# Patient Record
Sex: Female | Born: 1980 | Race: Black or African American | Hispanic: No | Marital: Single | Smoking: Former smoker
Health system: Southern US, Community
[De-identification: ages and names within clinical notes are randomized; demographics above are authoritative.]

## PROBLEM LIST (undated history)

## (undated) ENCOUNTER — Inpatient Hospital Stay (HOSPITAL_COMMUNITY): Payer: Self-pay

## (undated) DIAGNOSIS — R55 Syncope and collapse: Secondary | ICD-10-CM

## (undated) DIAGNOSIS — R87629 Unspecified abnormal cytological findings in specimens from vagina: Secondary | ICD-10-CM

## (undated) DIAGNOSIS — R87619 Unspecified abnormal cytological findings in specimens from cervix uteri: Secondary | ICD-10-CM

## (undated) DIAGNOSIS — R51 Headache: Secondary | ICD-10-CM

## (undated) DIAGNOSIS — K219 Gastro-esophageal reflux disease without esophagitis: Secondary | ICD-10-CM

## (undated) DIAGNOSIS — IMO0002 Reserved for concepts with insufficient information to code with codable children: Secondary | ICD-10-CM

## (undated) DIAGNOSIS — J40 Bronchitis, not specified as acute or chronic: Secondary | ICD-10-CM

## (undated) DIAGNOSIS — E876 Hypokalemia: Secondary | ICD-10-CM

## (undated) DIAGNOSIS — R519 Headache, unspecified: Secondary | ICD-10-CM

## (undated) HISTORY — DX: Reserved for concepts with insufficient information to code with codable children: IMO0002

## (undated) HISTORY — PX: WISDOM TOOTH EXTRACTION: SHX21

## (undated) HISTORY — DX: Unspecified abnormal cytological findings in specimens from cervix uteri: R87.619

---

## 2006-04-16 ENCOUNTER — Inpatient Hospital Stay (HOSPITAL_COMMUNITY): Admission: AD | Admit: 2006-04-16 | Discharge: 2006-04-16 | Payer: Self-pay | Admitting: Obstetrics & Gynecology

## 2006-04-18 ENCOUNTER — Ambulatory Visit: Payer: Self-pay | Admitting: Obstetrics and Gynecology

## 2006-04-18 ENCOUNTER — Encounter (INDEPENDENT_AMBULATORY_CARE_PROVIDER_SITE_OTHER): Payer: Self-pay | Admitting: Specialist

## 2006-04-18 ENCOUNTER — Inpatient Hospital Stay (HOSPITAL_COMMUNITY): Admission: AD | Admit: 2006-04-18 | Discharge: 2006-04-20 | Payer: Self-pay | Admitting: Obstetrics and Gynecology

## 2008-12-05 ENCOUNTER — Emergency Department (HOSPITAL_COMMUNITY): Admission: EM | Admit: 2008-12-05 | Discharge: 2008-12-06 | Payer: Self-pay | Admitting: Emergency Medicine

## 2009-08-29 ENCOUNTER — Emergency Department (HOSPITAL_COMMUNITY): Admission: EM | Admit: 2009-08-29 | Discharge: 2009-08-29 | Payer: Self-pay | Admitting: Emergency Medicine

## 2009-11-18 ENCOUNTER — Inpatient Hospital Stay (HOSPITAL_COMMUNITY): Admission: AD | Admit: 2009-11-18 | Discharge: 2009-11-18 | Payer: Self-pay | Admitting: Obstetrics and Gynecology

## 2010-01-01 ENCOUNTER — Emergency Department (HOSPITAL_COMMUNITY): Admission: EM | Admit: 2010-01-01 | Discharge: 2010-01-01 | Payer: Self-pay | Admitting: Emergency Medicine

## 2010-02-28 ENCOUNTER — Emergency Department (HOSPITAL_COMMUNITY): Admission: EM | Admit: 2010-02-28 | Discharge: 2010-02-28 | Payer: Self-pay | Admitting: Emergency Medicine

## 2010-08-02 ENCOUNTER — Inpatient Hospital Stay (HOSPITAL_COMMUNITY)
Admission: AD | Admit: 2010-08-02 | Discharge: 2010-08-02 | Payer: Self-pay | Source: Home / Self Care | Attending: Obstetrics and Gynecology | Admitting: Obstetrics and Gynecology

## 2010-08-07 NOTE — L&D Delivery Note (Signed)
Delivery Note At 11:41 PM a viable female was delivered via Vaginal, Spontaneous Delivery (Presentation: Left Occiput Anterior) in caul.  APGAR: 8, 9; weight . 7-1  Placenta status: Intact, Spontaneous.  Cord: 3 vessels with the following complications: None.   Anesthesia: None  Episiotomy: None Lacerations: None Suture Repair: NA Est. Blood Loss (mL): 400 ml  Mom to postpartum.  Baby to nursery-stable. Placenta to pathology for Oligo.  Lorita Forinash 03/31/2011, 12:06 AM

## 2010-08-11 ENCOUNTER — Inpatient Hospital Stay (HOSPITAL_COMMUNITY)
Admission: AD | Admit: 2010-08-11 | Discharge: 2010-08-11 | Payer: Self-pay | Source: Home / Self Care | Attending: Obstetrics & Gynecology | Admitting: Obstetrics & Gynecology

## 2010-08-11 LAB — URINALYSIS, ROUTINE W REFLEX MICROSCOPIC
Bilirubin Urine: NEGATIVE
Ketones, ur: NEGATIVE mg/dL
Nitrite: NEGATIVE
Protein, ur: NEGATIVE mg/dL
Specific Gravity, Urine: 1.025 (ref 1.005–1.030)
Urine Glucose, Fasting: NEGATIVE mg/dL
Urobilinogen, UA: 0.2 mg/dL (ref 0.0–1.0)
pH: 6 (ref 5.0–8.0)

## 2010-08-11 LAB — URINE MICROSCOPIC-ADD ON

## 2010-08-24 ENCOUNTER — Inpatient Hospital Stay (HOSPITAL_COMMUNITY)
Admission: AD | Admit: 2010-08-24 | Discharge: 2010-08-24 | Payer: Self-pay | Source: Home / Self Care | Attending: Obstetrics and Gynecology | Admitting: Obstetrics and Gynecology

## 2010-08-29 LAB — URINE MICROSCOPIC-ADD ON

## 2010-08-29 LAB — URINE CULTURE
Colony Count: NO GROWTH
Culture  Setup Time: 201201181628
Culture: NO GROWTH

## 2010-08-29 LAB — URINALYSIS, ROUTINE W REFLEX MICROSCOPIC
Ketones, ur: 15 mg/dL — AB
Nitrite: NEGATIVE
Protein, ur: 30 mg/dL — AB
Specific Gravity, Urine: >1.03 — ABNORMAL HIGH (ref 1.005–1.030)
Urine Glucose, Fasting: NEGATIVE mg/dL
Urobilinogen, UA: 0.2 mg/dL (ref 0.0–1.0)
pH: 6 (ref 5.0–8.0)

## 2010-09-03 ENCOUNTER — Emergency Department (HOSPITAL_COMMUNITY)
Admission: EM | Admit: 2010-09-03 | Discharge: 2010-09-03 | Payer: Self-pay | Source: Home / Self Care | Admitting: Emergency Medicine

## 2010-09-03 LAB — POCT I-STAT, CHEM 8
BUN: 8 mg/dL (ref 6–23)
Calcium, Ion: 1.16 mmol/L (ref 1.12–1.32)
Chloride: 102 mEq/L (ref 96–112)
Creatinine, Ser: 1 mg/dL (ref 0.4–1.2)
Glucose, Bld: 104 mg/dL — ABNORMAL HIGH (ref 70–99)
HCT: 37 % (ref 36.0–46.0)
Hemoglobin: 12.6 g/dL (ref 12.0–15.0)
Potassium: 3.8 mEq/L (ref 3.5–5.1)
Sodium: 136 mEq/L (ref 135–145)
TCO2: 23 mmol/L (ref 0–100)

## 2010-09-03 LAB — URINALYSIS, ROUTINE W REFLEX MICROSCOPIC
Ketones, ur: 15 mg/dL — AB
Nitrite: NEGATIVE
Protein, ur: 30 mg/dL — AB
Specific Gravity, Urine: 1.037 — ABNORMAL HIGH (ref 1.005–1.030)
Urine Glucose, Fasting: NEGATIVE mg/dL
Urobilinogen, UA: 1 mg/dL (ref 0.0–1.0)
pH: 6 (ref 5.0–8.0)

## 2010-09-03 LAB — URINE MICROSCOPIC-ADD ON

## 2010-09-03 LAB — HCG, QUANTITATIVE, PREGNANCY: hCG, Beta Chain, Quant, S: 172979 m[IU]/mL — ABNORMAL HIGH (ref <5)

## 2010-09-04 LAB — URINE CULTURE: Culture  Setup Time: 201201282155

## 2010-10-12 ENCOUNTER — Inpatient Hospital Stay (HOSPITAL_COMMUNITY)
Admission: AD | Admit: 2010-10-12 | Discharge: 2010-10-12 | Disposition: A | Discharge status: Home / Self Care | Payer: Medicaid Other | Source: Ambulatory Visit | Attending: Obstetrics & Gynecology | Admitting: Obstetrics & Gynecology

## 2010-10-12 DIAGNOSIS — O210 Mild hyperemesis gravidarum: Secondary | ICD-10-CM | POA: Insufficient documentation

## 2010-10-12 LAB — URINALYSIS, ROUTINE W REFLEX MICROSCOPIC
Glucose, UA: 100 mg/dL — AB
Ketones, ur: >80 mg/dL — AB
Nitrite: POSITIVE — AB
Protein, ur: 30 mg/dL — AB
Specific Gravity, Urine: 1.02 (ref 1.005–1.030)
Urobilinogen, UA: >8 mg/dL — ABNORMAL HIGH (ref 0.0–1.0)
pH: 8 (ref 5.0–8.0)

## 2010-10-12 LAB — URINE MICROSCOPIC-ADD ON

## 2010-10-14 LAB — URINE CULTURE
Colony Count: 10000
Culture  Setup Time: 201203080445

## 2010-10-17 LAB — URINE CULTURE
Colony Count: NO GROWTH
Culture  Setup Time: 201112280115
Culture: NO GROWTH

## 2010-10-17 LAB — URINALYSIS, ROUTINE W REFLEX MICROSCOPIC
Bilirubin Urine: NEGATIVE
Glucose, UA: NEGATIVE mg/dL
Ketones, ur: NEGATIVE mg/dL
Nitrite: NEGATIVE
Protein, ur: NEGATIVE mg/dL
Specific Gravity, Urine: 1.025 (ref 1.005–1.030)
Urobilinogen, UA: 0.2 mg/dL (ref 0.0–1.0)
pH: 7 (ref 5.0–8.0)

## 2010-10-17 LAB — URINE MICROSCOPIC-ADD ON

## 2010-10-17 LAB — POCT PREGNANCY, URINE: Preg Test, Ur: POSITIVE

## 2010-10-17 LAB — WET PREP, GENITAL
Clue Cells Wet Prep HPF POC: NONE SEEN
Yeast Wet Prep HPF POC: NONE SEEN

## 2010-10-18 ENCOUNTER — Observation Stay (HOSPITAL_COMMUNITY)
Admission: AD | Admit: 2010-10-18 | Discharge: 2010-10-19 | Disposition: A | Discharge status: Home / Self Care | Payer: Medicaid Other | Source: Ambulatory Visit | Attending: Obstetrics & Gynecology | Admitting: Obstetrics & Gynecology

## 2010-10-18 DIAGNOSIS — O210 Mild hyperemesis gravidarum: Secondary | ICD-10-CM | POA: Insufficient documentation

## 2010-10-18 DIAGNOSIS — O99019 Anemia complicating pregnancy, unspecified trimester: Secondary | ICD-10-CM

## 2010-10-18 DIAGNOSIS — O2390 Unspecified genitourinary tract infection in pregnancy, unspecified trimester: Principal | ICD-10-CM | POA: Insufficient documentation

## 2010-10-18 DIAGNOSIS — N12 Tubulo-interstitial nephritis, not specified as acute or chronic: Secondary | ICD-10-CM | POA: Insufficient documentation

## 2010-10-18 DIAGNOSIS — A5901 Trichomonal vulvovaginitis: Secondary | ICD-10-CM | POA: Insufficient documentation

## 2010-10-18 DIAGNOSIS — O98819 Other maternal infectious and parasitic diseases complicating pregnancy, unspecified trimester: Secondary | ICD-10-CM | POA: Insufficient documentation

## 2010-10-18 DIAGNOSIS — D649 Anemia, unspecified: Secondary | ICD-10-CM

## 2010-10-18 DIAGNOSIS — E876 Hypokalemia: Secondary | ICD-10-CM | POA: Insufficient documentation

## 2010-10-18 LAB — COMPREHENSIVE METABOLIC PANEL
ALT: 12 U/L (ref 0–35)
AST: 19 U/L (ref 0–37)
Alkaline Phosphatase: 42 U/L (ref 39–117)
Calcium: 8.7 mg/dL (ref 8.4–10.5)
GFR calc Af Amer: >60 mL/min (ref >60)
Potassium: 3 mEq/L — ABNORMAL LOW (ref 3.5–5.1)
Sodium: 136 mEq/L (ref 135–145)
Total Protein: 5.8 g/dL — ABNORMAL LOW (ref 6.0–8.3)

## 2010-10-18 LAB — DIFFERENTIAL
Basophils Absolute: 0 10*3/uL (ref 0.0–0.1)
Basophils Relative: 0 % (ref 0–1)
Eosinophils Relative: 0 % (ref 0–5)
Monocytes Absolute: 1.1 10*3/uL — ABNORMAL HIGH (ref 0.1–1.0)
Neutro Abs: 6.7 10*3/uL (ref 1.7–7.7)

## 2010-10-18 LAB — CBC
Hemoglobin: 9.8 g/dL — ABNORMAL LOW (ref 12.0–15.0)
MCHC: 33.9 g/dL (ref 30.0–36.0)
RDW: 15.5 % (ref 11.5–15.5)
WBC: 9.8 10*3/uL (ref 4.0–10.5)

## 2010-10-18 LAB — URINALYSIS, ROUTINE W REFLEX MICROSCOPIC
Glucose, UA: NEGATIVE mg/dL
Ketones, ur: >80 mg/dL — AB
Protein, ur: 100 mg/dL — AB
pH: 6 (ref 5.0–8.0)

## 2010-10-18 LAB — URINE MICROSCOPIC-ADD ON

## 2010-10-19 ENCOUNTER — Inpatient Hospital Stay (HOSPITAL_COMMUNITY): Payer: Medicaid Other

## 2010-10-19 LAB — WET PREP, GENITAL

## 2010-10-19 LAB — BASIC METABOLIC PANEL
Chloride: 107 mEq/L (ref 96–112)
GFR calc Af Amer: >60 mL/min (ref >60)
Potassium: 3.1 mEq/L — ABNORMAL LOW (ref 3.5–5.1)
Sodium: 132 mEq/L — ABNORMAL LOW (ref 135–145)

## 2010-10-19 LAB — GC/CHLAMYDIA PROBE AMP, GENITAL
Chlamydia, DNA Probe: NEGATIVE
GC Probe Amp, Genital: NEGATIVE

## 2010-10-20 LAB — URINE CULTURE: Culture  Setup Time: 201203140122

## 2010-10-24 LAB — URINALYSIS, ROUTINE W REFLEX MICROSCOPIC
Ketones, ur: NEGATIVE mg/dL
Nitrite: NEGATIVE
Protein, ur: NEGATIVE mg/dL
Urobilinogen, UA: 0.2 mg/dL (ref 0.0–1.0)

## 2010-10-24 LAB — CBC
HCT: 36.2 % (ref 36.0–46.0)
Hemoglobin: 12.2 g/dL (ref 12.0–15.0)
MCHC: 33.7 g/dL (ref 30.0–36.0)
RBC: 4.36 MIL/uL (ref 3.87–5.11)

## 2010-10-24 LAB — DIFFERENTIAL
Basophils Relative: 1 % (ref 0–1)
Eosinophils Absolute: 0.2 10*3/uL (ref 0.0–0.7)
Lymphs Abs: 2.7 10*3/uL (ref 0.7–4.0)
Neutrophils Relative %: 50 % (ref 43–77)

## 2010-10-24 LAB — COMPREHENSIVE METABOLIC PANEL
ALT: 9 U/L (ref 0–35)
Alkaline Phosphatase: 37 U/L — ABNORMAL LOW (ref 39–117)
CO2: 28 mEq/L (ref 19–32)
Calcium: 9.2 mg/dL (ref 8.4–10.5)
GFR calc non Af Amer: >60 mL/min (ref >60)
Glucose, Bld: 94 mg/dL (ref 70–99)
Sodium: 140 mEq/L (ref 135–145)

## 2010-10-24 LAB — LIPASE, BLOOD: Lipase: 20 U/L (ref 11–59)

## 2010-10-26 LAB — CBC
MCV: 83.4 fL (ref 78.0–100.0)
Platelets: 210 10*3/uL (ref 150–400)
RDW: 14.4 % (ref 11.5–15.5)
WBC: 6.8 10*3/uL (ref 4.0–10.5)

## 2010-10-26 LAB — URINALYSIS, ROUTINE W REFLEX MICROSCOPIC
Bilirubin Urine: NEGATIVE
Specific Gravity, Urine: 1.02 (ref 1.005–1.030)
pH: 7 (ref 5.0–8.0)

## 2010-10-26 LAB — URINE CULTURE

## 2010-10-26 LAB — WET PREP, GENITAL
Clue Cells Wet Prep HPF POC: NONE SEEN
Trich, Wet Prep: NONE SEEN

## 2010-10-26 LAB — URINE MICROSCOPIC-ADD ON

## 2010-10-26 LAB — HCG, SERUM, QUALITATIVE: Preg, Serum: NEGATIVE

## 2010-11-15 LAB — URINALYSIS, ROUTINE W REFLEX MICROSCOPIC
Bilirubin Urine: NEGATIVE
Ketones, ur: NEGATIVE mg/dL
Nitrite: POSITIVE — AB
pH: 6 (ref 5.0–8.0)

## 2010-11-15 LAB — URINE MICROSCOPIC-ADD ON

## 2010-11-15 LAB — POCT I-STAT, CHEM 8
BUN: 10 mg/dL (ref 6–23)
Calcium, Ion: 1.03 mmol/L — ABNORMAL LOW (ref 1.12–1.32)
Chloride: 106 mEq/L (ref 96–112)
Glucose, Bld: 101 mg/dL — ABNORMAL HIGH (ref 70–99)
TCO2: 22 mmol/L (ref 0–100)

## 2010-11-15 LAB — PREGNANCY, URINE: Preg Test, Ur: NEGATIVE

## 2010-11-24 ENCOUNTER — Inpatient Hospital Stay (HOSPITAL_COMMUNITY)
Admission: AD | Admit: 2010-11-24 | Discharge: 2010-11-24 | Disposition: A | Discharge status: Home / Self Care | Payer: Medicaid Other | Source: Ambulatory Visit | Attending: Obstetrics & Gynecology | Admitting: Obstetrics & Gynecology

## 2010-11-24 DIAGNOSIS — O210 Mild hyperemesis gravidarum: Secondary | ICD-10-CM | POA: Insufficient documentation

## 2010-11-24 LAB — URINALYSIS, ROUTINE W REFLEX MICROSCOPIC
Glucose, UA: NEGATIVE mg/dL
Hgb urine dipstick: NEGATIVE
Ketones, ur: 40 mg/dL — AB
Protein, ur: NEGATIVE mg/dL
pH: 8.5 — ABNORMAL HIGH (ref 5.0–8.0)

## 2010-12-06 ENCOUNTER — Other Ambulatory Visit: Payer: Self-pay | Admitting: Obstetrics & Gynecology

## 2010-12-06 DIAGNOSIS — Z3689 Encounter for other specified antenatal screening: Secondary | ICD-10-CM

## 2010-12-12 ENCOUNTER — Ambulatory Visit (HOSPITAL_COMMUNITY)
Admission: RE | Admit: 2010-12-12 | Discharge: 2010-12-12 | Disposition: A | Discharge status: Home / Self Care | Payer: Medicaid Other | Source: Ambulatory Visit | Attending: Obstetrics & Gynecology | Admitting: Obstetrics & Gynecology

## 2010-12-12 ENCOUNTER — Encounter (HOSPITAL_COMMUNITY): Payer: Self-pay

## 2010-12-12 DIAGNOSIS — O358XX0 Maternal care for other (suspected) fetal abnormality and damage, not applicable or unspecified: Secondary | ICD-10-CM | POA: Insufficient documentation

## 2010-12-12 DIAGNOSIS — Z1389 Encounter for screening for other disorder: Secondary | ICD-10-CM | POA: Insufficient documentation

## 2010-12-12 DIAGNOSIS — Z363 Encounter for antenatal screening for malformations: Secondary | ICD-10-CM | POA: Insufficient documentation

## 2010-12-12 DIAGNOSIS — O3410 Maternal care for benign tumor of corpus uteri, unspecified trimester: Secondary | ICD-10-CM | POA: Insufficient documentation

## 2010-12-12 DIAGNOSIS — Z3689 Encounter for other specified antenatal screening: Secondary | ICD-10-CM

## 2010-12-21 NOTE — Discharge Summary (Addendum)
  Erica Erica [name], Erica Erica [name]             ACCOUNT NO.:  [id number]  MEDICAL RECORD NO.:  [id number]           PATIENT TYPE:  O  LOCATION:  9303                          FACILITY:  [facility]  PHYSICIAN:  Fady Stamps S. Shawnie Pons, M.D.   DATE OF BIRTH:  March 05, 1981  DATE OF ADMISSION:  10/18/2010 DATE OF DISCHARGE:  10/19/2010                              DISCHARGE SUMMARY   ADMISSION DIAGNOSES: 1. Intrauterine pregnancy at 17 weeks. 2. Pyelonephritis. 3. Dehydration.  DISCHARGE DIAGNOSES: 1. Intrauterine pregnancy at 17 weeks. 2. Pyelonephritis. 3. Dehydration.  Follows with Dr. Orvan Falconer.  HISTORY AND PHYSICAL:  This is Erica [name] 30 year old, gravida 3, para 1-0-1-1 with intrauterine pregnancy at 17 weeks presenting with nausea, vomiting, and dehydration.  The patient was seen and evaluated in the MAU.  She had persistent vomiting.  PAST MEDICAL HISTORY:  Notable for former smoker.  GYNECOLOGICAL HISTORY:  Notable for Trichomonas, normal Pap smear.  HOME MEDICATIONS:  Prenatal vitamins.  PHYSICAL EXAMINATION ON ADMISSION:  Afebrile.  She was noted to have emesis on exam.  She also had left CVA tenderness and mild bilateral lower quadrant tenderness to palpation.  She had Erica [name] UA that showed Trichomonas, moderate leuks, negative nitrite. She has also had Erica [name] CBC with white count of 9.8, hemoglobin 9.8, hematocrit 28.9, and platelets were 281.  She had Erica [name] CMP with normal LFTs, normal creatinine, mild hypokalemia at 3.0.  She had Erica [name] ultrasound that showed Erica [name] viable intrauterine pregnancy.  Cervix appeared to be 3.8 cm long and closed.  Left ovary was not visualized.  The right ovary appeared to be normal.  There were no additional findings on exam.  The patient did receive some IV Phenergan, but she continued to be uncomfortable.  She did have Erica [name] pelvic exam that showed yellow frothy discharge with odor and friable cervix with cervical motion tenderness. She was admitted for pyelonephritis and dehydration.   Her hospital course remained benign.  Her potassium was repleted.  She was treated for her Trichomonas.  She received antibiotics for 24 hours and she was discharged home in stable condition.  DISCHARGE MEDICATIONS:  Unknown at the time of this dictation.  FOLLOWUP:  The patient is to [facility] up [facility] or where she receives her current prenatal care.  EMERGENCY ROOM WARNINGS:  The patient is to return to the emergency department with any fever, chills, nausea, vomiting, any worsening abdominal pain, decreased fetal movement, contractions, bleeding, spotting, or any other concerning symptoms.    ______________________________ Maryelizabeth Kaufmann, MD   ______________________________ Shelbie Proctor. Shawnie Pons, M.D.    LC/MEDQ  D:  12/20/2010  Erica:  12/20/2010  Job:  [id number]  Electronically Signed by Maryelizabeth Kaufmann MD on 12/27/2010 04:34:23 PM Electronically Signed by Tinnie Gens M.D. on 01/03/2011 10:01:23 AM

## 2010-12-28 ENCOUNTER — Inpatient Hospital Stay (HOSPITAL_COMMUNITY): Payer: Medicaid Other

## 2010-12-28 ENCOUNTER — Inpatient Hospital Stay (HOSPITAL_COMMUNITY)
Admission: AD | Admit: 2010-12-28 | Discharge: 2010-12-28 | Disposition: A | Discharge status: Home / Self Care | Payer: Medicaid Other | Source: Ambulatory Visit | Attending: Obstetrics & Gynecology | Admitting: Obstetrics & Gynecology

## 2010-12-28 DIAGNOSIS — A499 Bacterial infection, unspecified: Secondary | ICD-10-CM | POA: Insufficient documentation

## 2010-12-28 DIAGNOSIS — N760 Acute vaginitis: Secondary | ICD-10-CM | POA: Insufficient documentation

## 2010-12-28 DIAGNOSIS — O4700 False labor before 37 completed weeks of gestation, unspecified trimester: Secondary | ICD-10-CM | POA: Insufficient documentation

## 2010-12-28 DIAGNOSIS — O2390 Unspecified genitourinary tract infection in pregnancy, unspecified trimester: Secondary | ICD-10-CM

## 2010-12-28 DIAGNOSIS — B9689 Other specified bacterial agents as the cause of diseases classified elsewhere: Secondary | ICD-10-CM | POA: Insufficient documentation

## 2010-12-28 DIAGNOSIS — R109 Unspecified abdominal pain: Secondary | ICD-10-CM

## 2010-12-28 LAB — URINALYSIS, ROUTINE W REFLEX MICROSCOPIC
Bilirubin Urine: NEGATIVE
Glucose, UA: NEGATIVE mg/dL
Hgb urine dipstick: NEGATIVE
Ketones, ur: NEGATIVE mg/dL
Nitrite: NEGATIVE
Specific Gravity, Urine: 1.015 (ref 1.005–1.030)
pH: 8.5 — ABNORMAL HIGH (ref 5.0–8.0)

## 2010-12-28 LAB — URINE MICROSCOPIC-ADD ON

## 2010-12-28 LAB — WET PREP, GENITAL: Yeast Wet Prep HPF POC: NONE SEEN

## 2010-12-29 LAB — GC/CHLAMYDIA PROBE AMP, GENITAL: Chlamydia, DNA Probe: NEGATIVE

## 2011-02-06 ENCOUNTER — Inpatient Hospital Stay (HOSPITAL_COMMUNITY): Payer: Medicaid Other

## 2011-02-06 ENCOUNTER — Inpatient Hospital Stay (HOSPITAL_COMMUNITY)
Admission: AD | Admit: 2011-02-06 | Discharge: 2011-02-06 | Disposition: A | Discharge status: Home / Self Care | Payer: Medicaid Other | Source: Ambulatory Visit | Attending: Obstetrics & Gynecology | Admitting: Obstetrics & Gynecology

## 2011-02-06 ENCOUNTER — Inpatient Hospital Stay (HOSPITAL_COMMUNITY)
Admission: AD | Admit: 2011-02-06 | Payer: Medicaid Other | Source: Ambulatory Visit | Admitting: Obstetrics & Gynecology

## 2011-02-06 DIAGNOSIS — O99019 Anemia complicating pregnancy, unspecified trimester: Secondary | ICD-10-CM | POA: Insufficient documentation

## 2011-02-06 DIAGNOSIS — D649 Anemia, unspecified: Secondary | ICD-10-CM | POA: Insufficient documentation

## 2011-02-06 DIAGNOSIS — O368190 Decreased fetal movements, unspecified trimester, not applicable or unspecified: Secondary | ICD-10-CM | POA: Insufficient documentation

## 2011-02-06 LAB — RAPID URINE DRUG SCREEN, HOSP PERFORMED
Benzodiazepines: NOT DETECTED
Cocaine: NOT DETECTED
Opiates: NOT DETECTED

## 2011-02-06 LAB — DIFFERENTIAL
Eosinophils Absolute: 0.1 10*3/uL (ref 0.0–0.7)
Eosinophils Relative: 1 % (ref 0–5)
Lymphocytes Relative: 26 % (ref 12–46)
Lymphs Abs: 2.1 10*3/uL (ref 0.7–4.0)
Monocytes Relative: 7 % (ref 3–12)

## 2011-02-06 LAB — CBC
HCT: 27.8 % — ABNORMAL LOW (ref 36.0–46.0)
MCH: 28.1 pg (ref 26.0–34.0)
MCV: 87.7 fL (ref 78.0–100.0)
RBC: 3.17 MIL/uL — ABNORMAL LOW (ref 3.87–5.11)
RDW: 13.3 % (ref 11.5–15.5)
WBC: 7.8 10*3/uL (ref 4.0–10.5)

## 2011-02-07 LAB — TYPE AND SCREEN

## 2011-02-07 LAB — HIV ANTIBODY (ROUTINE TESTING W REFLEX): HIV: NONREACTIVE

## 2011-02-07 LAB — HEPATITIS B SURFACE ANTIGEN: Hepatitis B Surface Ag: NEGATIVE

## 2011-02-15 ENCOUNTER — Ambulatory Visit (INDEPENDENT_AMBULATORY_CARE_PROVIDER_SITE_OTHER): Payer: Medicaid Other | Admitting: Physician Assistant

## 2011-02-15 ENCOUNTER — Other Ambulatory Visit: Payer: Self-pay | Admitting: Obstetrics & Gynecology

## 2011-02-15 VITALS — BP 102/60 | Temp 98.5°F | Ht 65.0 in | Wt 164.5 lb

## 2011-02-15 DIAGNOSIS — O368190 Decreased fetal movements, unspecified trimester, not applicable or unspecified: Secondary | ICD-10-CM

## 2011-02-15 DIAGNOSIS — O0930 Supervision of pregnancy with insufficient antenatal care, unspecified trimester: Secondary | ICD-10-CM

## 2011-02-15 DIAGNOSIS — Z3480 Encounter for supervision of other normal pregnancy, unspecified trimester: Secondary | ICD-10-CM

## 2011-02-15 LAB — FETAL NONSTRESS TEST

## 2011-02-15 LAB — POCT URINALYSIS DIP (DEVICE)
Ketones, ur: NEGATIVE mg/dL
Protein, ur: NEGATIVE mg/dL
Specific Gravity, Urine: 1.015 (ref 1.005–1.030)
pH: 7 (ref 5.0–8.0)

## 2011-02-15 MED ORDER — PRENATAL RX 60-1 MG PO TABS: 1.0000 | ORAL_TABLET | Freq: Every day | ORAL | Status: AC

## 2011-02-15 NOTE — Progress Notes (Signed)
Edema -feet, pain and pressure pelvic area, no vaginal discharge

## 2011-02-15 NOTE — Progress Notes (Signed)
Subjective:    Erica [name] is being seen today for her first obstetrical visit.  This is not a planned pregnancy. She is at [redacted]w[redacted]d gestation. Her obstetrical history is significant for Late prenatal care. Relationship with FOB: significant other, living together.  Pregnancy history fully reviewed.  Menstrual History: OB History    Grav Para Term Preterm Abortions TAB SAB Ect Mult Living   3 1 1  1  1   1        Patient's last menstrual period was 06/18/2010.    The following portions of the patient's history were reviewed and updated as appropriate: past medical history, past social history, past surgical history and problem list.  Review of Systems Pertinent items are noted in HPI.    Objective:    34.4 weeks  Assessment:    Pregnancy at 34 and 4/7 weeks    Plan:    Initial labs drawn. Prenatal vitamins. Problem list reviewed and updated. AFP3 discussed: too late. Role of ultrasound in pregnancy discussed; fetal survey: results reviewed. Amniocentesis discussed: not indicated. Follow up in 2 weeks.   Subjective:    Erica [name] is a 30 y.o. female being seen today for her obstetrical visit. She is at [redacted]w[redacted]d gestation. Patient reports occasional contractions. Fetal movement: normal.  Menstrual History: OB History    Grav Para Term Preterm Abortions TAB SAB Ect Mult Living   3 1 1  1  1   1        Patient's last menstrual period was 06/18/2010.    The following portions of the patient's history were reviewed and updated as appropriate: allergies, current medications, past family history, past medical history, past social history, past surgical history and problem list.  Review of Systems Pertinent items are noted in HPI.   Objective:    BP 102/60  Temp 98.5 F (36.9 C)  Ht 5\' 5"  (1.651 m)  Wt 164 lb 7.4 oz (74.6 kg)  BMI 27.37 kg/m2  LMP 06/18/2010 FHT: 135 BPM  Uterine Size: 37 cm  Presentations: cephalic  Pelvic Exam:              Dilation:  Closed       Effacement: Long             Station:  -3    Consistency: soft            Position: middle     Assessment:    Pregnancy 36 and 4/7 weeks   Plan:   Plans for delivery: Vaginal anticipated Beta strep culture: Collected today Counseling: L&D discussion: symptoms of labor, rupture of membranes and anesthetic/analgesic options reviewed. Follow up in 1 Week.

## 2011-02-16 LAB — OBSTETRIC PANEL
Basophils Absolute: 0 10*3/uL (ref 0.0–0.1)
HCT: 30.1 % — ABNORMAL LOW (ref 36.0–46.0)
Hepatitis B Surface Ag: NEGATIVE
Lymphocytes Relative: 25 % (ref 12–46)
Monocytes Absolute: 0.7 10*3/uL (ref 0.1–1.0)
Neutro Abs: 5.1 10*3/uL (ref 1.7–7.7)
RDW: 13.6 % (ref 11.5–15.5)
Rubella: 14.9 IU/mL — ABNORMAL HIGH
WBC: 7.8 10*3/uL (ref 4.0–10.5)

## 2011-02-16 LAB — GLUCOSE TOLERANCE, 1 HOUR: Glucose, 1 Hour GTT: 91 mg/dL (ref 70–140)

## 2011-02-17 LAB — URINE CULTURE

## 2011-02-22 ENCOUNTER — Other Ambulatory Visit: Payer: Self-pay | Admitting: Physician Assistant

## 2011-02-22 ENCOUNTER — Encounter: Payer: Self-pay | Admitting: Physician Assistant

## 2011-02-22 DIAGNOSIS — R8271 Bacteriuria: Secondary | ICD-10-CM

## 2011-02-22 MED ORDER — CEPHALEXIN 500 MG PO CAPS: 500.0000 mg | ORAL_CAPSULE | Freq: Four times a day (QID) | ORAL | Status: AC

## 2011-03-01 ENCOUNTER — Ambulatory Visit: Payer: Medicaid Other | Admitting: Physician Assistant

## 2011-03-01 DIAGNOSIS — O0930 Supervision of pregnancy with insufficient antenatal care, unspecified trimester: Secondary | ICD-10-CM

## 2011-03-01 DIAGNOSIS — R8271 Bacteriuria: Secondary | ICD-10-CM

## 2011-03-01 NOTE — Progress Notes (Signed)
Edema on feet Pain and pressure on pelvic area.

## 2011-03-01 NOTE — Patient Instructions (Signed)
Natural Childbirth Natural childbirth is going through labor and delivery without any pain medication or having anesthesia (epidural or spinal). You also do not use fetal monitors, get a Cesarean Section (an incision in your lower stomach) or episiotomy (a cut on the outside of the lower vagina). With the help of a birthing professional (midwife), you will direct your own labor and delivery as you choose. Many women chose natural childbirth because they feel more in control and in touch with their labor and delivery. They are also concerned about the medications affecting themselves and the baby. Pregnant women with a high risk pregnancy should not attempt natural childbirth. This is because of the risks to themselves and the baby. It is better to deliver their baby in a hospital if an emergency situation arises. The caregiver has to intervene for the health and safety of the mother and baby. Pain during labor is the result of the cervix dilating and the uterus contracting to push the baby out through the vagina. Pain is also caused by stress, anxiety and the muscles of your body being tense, unrelaxed and out of shape. TWO TECHNIQUES FOR NATURAL CHILDBIRTH:   The Lamaze method teaches women that having a baby is normal, healthy and natural. It also teaches the mother to take a neutral position regarding pain medication and anesthesia and to make an informed decision if and when it is right for them.   The Erven Colla (also called Husband Coached Birth) teaches the father to be the birth coach and stresses a natural approach. It also encourages exercise and a balanced diet with good nutrition. The exercises teach relaxation and deep breathing techniques. However, there are also classes to prepare the parents for an emergency situation that may occur.  METHODS OF DEALING WITH LABOR PAIN AND DELIVERY:  Meditation.  Yoga.   Hypnosis.   Acupuncture.   Massage.   Changing positions (walking,  rocking, showering, leaning on birth balls).  Lying in warm water or a jacuzzi.   Get yourself some type of an activity that keeps your mind off of the labor pain.  Listen to soft music.   Visual imagery (focus on a particular object).   BEFORE GOING INTO LABOR  Be sure you and your spouse/partner are in agreement to have natural childbirth.   Decide if your caregiver or a midwife will deliver your baby.   Decide if you will have your baby in the hospital, birthing center or at home.   If you have children, make plans to have someone to take care of them when you go to the hospital.   Know the distance and the time it takes to go to the delivery center. Make a dry run to be sure.   Have a bag packed with a night gown, bathrobe and toiletries ready to take when you go into labor.   Keep phone numbers of your family and friends handy if you need to call someone when you go into labor.   Your spouse/partner should go to all the teaching classes.   Talk with your caregiver the possibility of medical emergency and what will happen if that occurs.  ADVANTAGES OF NATURAL CHILDBIRTH  You are in control of your labor and delivery.   It is safe.   There are no medications or anesthetics that may affect you and the baby.   There are no invasive procedures such as an episiotomy.   You and your partner will work together and that increases  your bond.   Meditation, yoga, massage and breathing exercise can be learned while pregnant and help you when you are in labor and at delivery.   In most delivery centers, the family and friends can be involved in the labor and delivery process.  DISADVANTAGES OF NATURAL CHILDBIRTH  You will experience pain during your labor and delivery.   The methods (stated above) of helping relieve your labor pains may not work for you.   You may feel embarrassed, disappointed and a failure if you decide to change your mind during labor and not have natural  childbirth.  AFTER THE DELIVERY  You will be very tired.   You will be uncomfortable because of your uterus contracting. You will feel soreness around the vagina.   You may feel cold and shaky, this is a natural reaction.   You will be excited, overwhelmed, accomplished and proud to be a mother.  HOME CARE INSTRUCTIONS  Follow the advice and instructions of your caregiver.   Follow the instructions of your natural childbirth instructor (Lamaze or Bradley Method).  Document Released: 07/06/2008  [facility] Patient Information 2011 [place], [place].Place 32-42 weeks prenatal visit patient instructions here.

## 2011-03-08 ENCOUNTER — Ambulatory Visit (INDEPENDENT_AMBULATORY_CARE_PROVIDER_SITE_OTHER): Payer: Medicaid Other | Admitting: Advanced Practice Midwife

## 2011-03-08 DIAGNOSIS — R8271 Bacteriuria: Secondary | ICD-10-CM

## 2011-03-08 DIAGNOSIS — O368190 Decreased fetal movements, unspecified trimester, not applicable or unspecified: Secondary | ICD-10-CM

## 2011-03-08 DIAGNOSIS — O0930 Supervision of pregnancy with insufficient antenatal care, unspecified trimester: Secondary | ICD-10-CM

## 2011-03-08 DIAGNOSIS — R82998 Other abnormal findings in urine: Secondary | ICD-10-CM

## 2011-03-08 LAB — POCT URINALYSIS DIP (DEVICE)
Hgb urine dipstick: NEGATIVE
Nitrite: NEGATIVE
Protein, ur: NEGATIVE mg/dL
Urobilinogen, UA: >=8 mg/dL (ref 0.0–1.0)
pH: 7.5 (ref 5.0–8.0)

## 2011-03-08 LAB — FETAL NONSTRESS TEST

## 2011-03-08 NOTE — Patient Instructions (Signed)
To to MAU with regular contractions, vaginal bleeding, leaking fluid or decreased fetal movement. Otherwise, follow up as scheduled.

## 2011-03-08 NOTE — Progress Notes (Signed)
Reports no fetal movement since last night. NST reactive in clinic today, AFI=10. Fetal breathing movements and gross movements noted during u/s for AFI. Rev'd precautions. GBS negative status reviewed with patient.

## 2011-03-08 NOTE — Progress Notes (Signed)
Pt states has not felt the baby move since last night. No vaginal discharge. P-71

## 2011-03-19 ENCOUNTER — Inpatient Hospital Stay (HOSPITAL_COMMUNITY)
Admission: AD | Admit: 2011-03-19 | Payer: Medicaid Other | Source: Ambulatory Visit | Admitting: Obstetrics & Gynecology

## 2011-03-22 ENCOUNTER — Ambulatory Visit (INDEPENDENT_AMBULATORY_CARE_PROVIDER_SITE_OTHER): Payer: Medicaid Other | Admitting: Family Medicine

## 2011-03-22 ENCOUNTER — Other Ambulatory Visit: Payer: Self-pay | Admitting: Family Medicine

## 2011-03-22 DIAGNOSIS — R8271 Bacteriuria: Secondary | ICD-10-CM

## 2011-03-22 DIAGNOSIS — R82998 Other abnormal findings in urine: Secondary | ICD-10-CM

## 2011-03-22 DIAGNOSIS — K219 Gastro-esophageal reflux disease without esophagitis: Secondary | ICD-10-CM

## 2011-03-22 DIAGNOSIS — O0930 Supervision of pregnancy with insufficient antenatal care, unspecified trimester: Secondary | ICD-10-CM

## 2011-03-22 LAB — POCT URINALYSIS DIP (DEVICE)
Glucose, UA: NEGATIVE mg/dL
Nitrite: NEGATIVE
Protein, ur: 30 mg/dL — AB
Specific Gravity, Urine: 1.015 (ref 1.005–1.030)
Urobilinogen, UA: 2 mg/dL — ABNORMAL HIGH (ref 0.0–1.0)
pH: 7 (ref 5.0–8.0)

## 2011-03-22 MED ORDER — PANTOPRAZOLE SODIUM 40 MG PO TBEC: 40.0000 mg | DELAYED_RELEASE_TABLET | Freq: Every day | ORAL | Status: DC

## 2011-03-22 NOTE — Patient Instructions (Signed)
HOME CARE INSTRUCTIONS  Keep up with your usual exercises and instructions.   Take medications as directed.   Keep your regular prenatal appointment.   Eat and drink lightly if you think you are going into labor.   SEEK IMMEDIATE MEDICAL CARE IF:  Your contractions continue to become stronger, more regular, and closer together.   You have a gushing, burst or leaking of fluid from the vagina.   An oral temperature above 100.79F develops.   You have passage of blood-tinged mucus.   You develop vaginal bleeding.   You develop continuous belly (abdominal) pain.   You have low back pain that you never had before.   You feel the baby's head pushing down causing pelvic pressure.   The baby is not moving as much as it used to.  Document Released: 07/24/2005 Document Re-Released: 01/11/2010 [facility] Patient Information 2011 [place], [place].

## 2011-03-22 NOTE — Progress Notes (Signed)
P. 73 C/o " a lot of pelvic pain and pressure x 1 week, c/o edema feet/legs, denies vaginal discharge, c/o coughing up mucous whole pregnancy, but yesterday c/o coughing up a little blood with the mucous

## 2011-03-22 NOTE — Progress Notes (Signed)
Patient presents today for routine OB. She is G3P1011 at 39.[redacted] wks EGA. She was late to prenatal care. C/O cough since she got pregnant, worse with eating and laying down. Denies symptoms of heartburn. Does not smoke. Has no history of sick contacts.  FHTs: 138 Fundal Height: 39.5 Gen: AAO, NAD Heart: RRR, no murmur Lungs: CTA B/L, no wheezing SVE: FT/long/high  A/P Pregnancy: GBS neg. Will get GC/Ch off today's urine. Pregnancy precautions discussed. F/U 1 week and will schedule IOL at that time if still pregnant.  Cough: History characteristic of reflux. Will try protonix daily and cont to reeval. Lungs clear.

## 2011-03-24 LAB — CHLAMYDIA TRACHOMATIS, DNA, AMP PROBE: Chlamydia, DNA Probe: NEGATIVE

## 2011-03-25 ENCOUNTER — Inpatient Hospital Stay (HOSPITAL_COMMUNITY)
Admission: AD | Admit: 2011-03-25 | Discharge: 2011-03-25 | Disposition: A | Discharge status: Home / Self Care | Payer: Medicaid Other | Source: Ambulatory Visit | Attending: Obstetrics and Gynecology | Admitting: Obstetrics and Gynecology

## 2011-03-25 ENCOUNTER — Encounter (HOSPITAL_COMMUNITY): Payer: Self-pay | Admitting: *Deleted

## 2011-03-25 DIAGNOSIS — O479 False labor, unspecified: Secondary | ICD-10-CM | POA: Insufficient documentation

## 2011-03-25 DIAGNOSIS — R8271 Bacteriuria: Secondary | ICD-10-CM

## 2011-03-25 DIAGNOSIS — O0930 Supervision of pregnancy with insufficient antenatal care, unspecified trimester: Secondary | ICD-10-CM

## 2011-03-25 NOTE — Progress Notes (Addendum)
Subjective: Pt reports irregular contractions and LOF all day today.  Reports +FM, denies vaginal bleeding, h/a, epigastric pain or visual disturbances.  Family member at bedside for support.   Objective: BP 135/71   Pulse 84   Temp(Src) 98.1 F (36.7 C) (Oral)   Resp 20   Ht 5\' 5"  (1.651 m)   Wt 78.109 kg (172 lb 3.2 oz)   BMI 28.66 kg/m2   LMP 06/18/2010  Heart: RRR Lungs: Clear and equal bilat. DTR: +2 BLE  SVE: Cervix 1-2/50/-3 SSE with slide taken: Ferning and pooling negative  Assessment: False labor  Plan: D/C home tonight Labor precautions given

## 2011-03-25 NOTE — Progress Notes (Signed)
Patient reports leaking fluid since this morning, having contractions which are strong unable to time.

## 2011-03-27 ENCOUNTER — Ambulatory Visit (INDEPENDENT_AMBULATORY_CARE_PROVIDER_SITE_OTHER): Payer: Medicaid Other | Admitting: Obstetrics and Gynecology

## 2011-03-27 DIAGNOSIS — R8271 Bacteriuria: Secondary | ICD-10-CM

## 2011-03-27 DIAGNOSIS — O99891 Other specified diseases and conditions complicating pregnancy: Secondary | ICD-10-CM

## 2011-03-27 DIAGNOSIS — O0930 Supervision of pregnancy with insufficient antenatal care, unspecified trimester: Secondary | ICD-10-CM

## 2011-03-27 DIAGNOSIS — O480 Post-term pregnancy: Secondary | ICD-10-CM

## 2011-03-27 LAB — POCT URINALYSIS DIP (DEVICE)
Ketones, ur: NEGATIVE mg/dL
Protein, ur: NEGATIVE mg/dL
Specific Gravity, Urine: 1.02 (ref 1.005–1.030)
Urobilinogen, UA: 2 mg/dL — ABNORMAL HIGH (ref 0.0–1.0)

## 2011-03-27 NOTE — Progress Notes (Signed)
Patient doing well. FM/Labor precautions reviewed. Will start postdate fetal testing today and have patient return on Thursday or Friday for NST/AFI. Plan for IOL on 04/01/2011.

## 2011-03-27 NOTE — Progress Notes (Signed)
Edema on legs and feet. Pain on rectal and lower back. Pain scale of 8/10. Pressure on pelvis.  Vaginal bleed X2 days.

## 2011-03-27 NOTE — Progress Notes (Signed)
Addended by: Jill Side on: 03/27/2011 11:52 AM   Modules accepted: Orders

## 2011-03-29 ENCOUNTER — Telehealth (HOSPITAL_COMMUNITY): Payer: Self-pay | Admitting: *Deleted

## 2011-03-29 NOTE — Telephone Encounter (Signed)
Preadmission screen  

## 2011-03-30 ENCOUNTER — Inpatient Hospital Stay (HOSPITAL_COMMUNITY)
Admission: AD | Admit: 2011-03-30 | Discharge: 2011-04-01 | DRG: 775 | Disposition: A | Discharge status: Home / Self Care | Payer: Medicaid Other | Source: Ambulatory Visit | Attending: Obstetrics & Gynecology | Admitting: Obstetrics & Gynecology

## 2011-03-30 ENCOUNTER — Encounter (HOSPITAL_COMMUNITY): Payer: Self-pay | Admitting: *Deleted

## 2011-03-30 ENCOUNTER — Ambulatory Visit (INDEPENDENT_AMBULATORY_CARE_PROVIDER_SITE_OTHER): Payer: Medicaid Other | Admitting: *Deleted

## 2011-03-30 ENCOUNTER — Telehealth (HOSPITAL_COMMUNITY): Payer: Self-pay | Admitting: *Deleted

## 2011-03-30 DIAGNOSIS — O4100X0 Oligohydramnios, unspecified trimester, not applicable or unspecified: Principal | ICD-10-CM | POA: Diagnosis present

## 2011-03-30 DIAGNOSIS — O0930 Supervision of pregnancy with insufficient antenatal care, unspecified trimester: Secondary | ICD-10-CM

## 2011-03-30 DIAGNOSIS — R8271 Bacteriuria: Secondary | ICD-10-CM

## 2011-03-30 DIAGNOSIS — O480 Post-term pregnancy: Secondary | ICD-10-CM

## 2011-03-30 LAB — CBC
MCH: 25.2 pg — ABNORMAL LOW (ref 26.0–34.0)
MCHC: 30.9 g/dL (ref 30.0–36.0)
MCV: 81.6 fL (ref 78.0–100.0)
Platelets: 236 10*3/uL (ref 150–400)
RBC: 3.21 MIL/uL — ABNORMAL LOW (ref 3.87–5.11)

## 2011-03-30 MED ORDER — NALBUPHINE SYRINGE 5 MG/0.5 ML
10.0000 mg | INJECTION | Freq: Once | INTRAMUSCULAR | Status: AC
  Administered 2011-03-30: 10 mg via INTRAVENOUS
  Filled 2011-03-30: qty 0.5

## 2011-03-30 MED ORDER — OXYCODONE-ACETAMINOPHEN 5-325 MG PO TABS
2.0000 | ORAL_TABLET | ORAL | Status: DC | PRN
  Administered 2011-03-31: 2 via ORAL
  Filled 2011-03-30: qty 2

## 2011-03-30 MED ORDER — LIDOCAINE HCL (PF) 1 % IJ SOLN
30.0000 mL | INTRAMUSCULAR | Status: DC | PRN
  Filled 2011-03-30 (×2): qty 30

## 2011-03-30 MED ORDER — IBUPROFEN 600 MG PO TABS
600.0000 mg | ORAL_TABLET | Freq: Four times a day (QID) | ORAL | Status: DC | PRN
  Administered 2011-03-31: 600 mg via ORAL
  Filled 2011-03-30: qty 1

## 2011-03-30 MED ORDER — ONDANSETRON HCL 4 MG/2ML IJ SOLN
4.0000 mg | Freq: Four times a day (QID) | INTRAMUSCULAR | Status: DC | PRN
  Administered 2011-03-30: 4 mg via INTRAVENOUS
  Filled 2011-03-30: qty 2

## 2011-03-30 MED ORDER — ACETAMINOPHEN 325 MG PO TABS: 650.0000 mg | ORAL_TABLET | ORAL | Status: DC | PRN

## 2011-03-30 MED ORDER — LACTATED RINGERS IV SOLN
INTRAVENOUS | Status: DC
  Administered 2011-03-30: 18:00:00 via INTRAVENOUS

## 2011-03-30 MED ORDER — OXYTOCIN BOLUS FROM INFUSION
500.0000 mL | Freq: Once | INTRAVENOUS | Status: DC
  Filled 2011-03-30: qty 500

## 2011-03-30 MED ORDER — OXYTOCIN 20 UNITS IN LACTATED RINGERS INFUSION - SIMPLE
1.0000 m[IU]/min | INTRAVENOUS | Status: DC
  Administered 2011-03-30: 2 m[IU]/min via INTRAVENOUS
  Administered 2011-03-30: 1 m[IU]/min via INTRAVENOUS
  Filled 2011-03-30: qty 1000

## 2011-03-30 MED ORDER — OXYTOCIN 20 UNITS IN LACTATED RINGERS INFUSION - SIMPLE: 125.0000 mL/h | INTRAVENOUS | Status: AC

## 2011-03-30 MED ORDER — CITRIC ACID-SODIUM CITRATE 334-500 MG/5ML PO SOLN: 30.0000 mL | ORAL | Status: DC | PRN

## 2011-03-30 MED ORDER — TERBUTALINE SULFATE 1 MG/ML IJ SOLN
0.2500 mg | Freq: Once | INTRAMUSCULAR | Status: AC | PRN
Start: 2011-03-30 — End: 2011-03-30

## 2011-03-30 MED ORDER — FLEET ENEMA 7-19 GM/118ML RE ENEM: 1.0000 | ENEMA | RECTAL | Status: DC | PRN

## 2011-03-30 MED ORDER — LACTATED RINGERS IV SOLN: 500.0000 mL | INTRAVENOUS | Status: DC | PRN

## 2011-03-30 NOTE — Progress Notes (Signed)
Erica [name] is a 30 y.o. G3P1011 at [redacted]w[redacted]d  Subjective: Feeling pressure w/ UC's. Requesting IV pain meds. N/V, requesting meds  Objective: BP 133/92  Pulse 71  Temp(Src) 98.6 F (37 C) (Oral)  Resp 18  Ht 5\' 5"  (1.651 m)  Wt 78.019 kg (172 lb)  BMI 28.62 kg/m2  LMP 06/18/2010      FHT:  FHR: 120 bpm, variability: moderate,  accelerations:  Present,  decelerations:  Present early and few variable decels UC:   regular, every 2-4 minutes, strong SVE: 7/80/-1, BBOW, no LOF Labs: Lab Results  Component Value Date   WBC 7.6 03/30/2011   HGB 8.1* 03/30/2011   HCT 26.2* 03/30/2011   MCV 81.6 03/30/2011   PLT 236 03/30/2011    Assessment / Plan: Induction of labor due to oligo,  progressing well on pitocin  Labor: Progressing on Pitocin, will continue to increase then AROM Preeclampsia:  NA Fetal Wellbeing:  Category II Pain Control:  Requesting IV pain meds Anticipated MOD:  NSVD May have zofran and Nubain  Erica [name] 03/30/2011, 10:49 PM

## 2011-03-30 NOTE — Progress Notes (Signed)
Jenice Lannan is a 30 y.o. G3P1011 at [redacted]w[redacted]d w/ IOL in process.   Subjective: She reports stronger UC's.  Objective: BP 133/92  Pulse 71  Temp(Src) 98.6 F (37 C) (Oral)  Resp 18  Ht 5\' 5"  (1.651 m)  Wt 78.019 kg (172 lb)  BMI 28.62 kg/m2  LMP 06/18/2010      FHT:  FHR: 130 bpm, variability: moderate,  accelerations:  Present,  decelerations:  Absent UC:   regular, every 2-5 minutes SVE:   Dilation: 5 Effacement (%): 80 Station: -2 Exam by:: Dorathy Kinsman, CNM  Labs: Lab Results  Component Value Date   WBC 7.6 03/30/2011   HGB 8.1* 03/30/2011   HCT 26.2* 03/30/2011   MCV 81.6 03/30/2011   PLT 236 03/30/2011    Assessment / Plan: Induction of labor due to Oligo, ? high leak,  progressing well on pitocin  Labor: Progressing on Pitocin, will continue to increase then AROM Preeclampsia:  NA Fetal Wellbeing:  Category I Pain Control:  Labor support without medications Anticipated MOD:  NSVD  Ayrianna Mcginniss 03/30/2011, 2049 PM

## 2011-03-30 NOTE — H&P (Signed)
  Erica [name] is a 30 y.o. female presenting for contractions and leaking fluid.  She states that she has been contracting every 2-5 minutes, with strong contractions.  States leaking fluid since 8/18, a ferning/pooling test was negative at that time.  She has continued to experience some leaking fluid, though less that previously.  She had an AFI ultrasound today showed decreased amniotic fluid so her induction date was moved from 8/25 to today.  Denies vaginal bleeding, headache, vision changes.    Has received prenatal starting at 5-6 months gestation at the [facility].  No history of diabetes or hypertension during the pregnancy.  Baby vertex on ultrasound today.  Planning on breast feeding and using implanon for birth control.  Would like to have natural birth with out an epidural.  History OB History    Grav Para Term Preterm Abortions TAB SAB Ect Mult Living   3 1 1  1  1   1      Past Medical History  Diagnosis Date  . No pertinent past medical history    Past Surgical History  Procedure Date  . No past surgeries    Family History: family history includes Arthritis in her father and mother; Asthma in her mother; Diabetes in her father and mother; and Hypertension in her father and mother. Social History:  reports that she quit smoking about 9 months ago. She has never used smokeless tobacco. She reports that she does not drink alcohol or use illicit drugs.  Review of Systems  All other systems reviewed and are negative.      Blood pressure 117/71, pulse 58, temperature 97.9 F (36.6 C), temperature source Oral, resp. rate 18, last menstrual period 06/18/2010. Maternal Exam:  Uterine Assessment: Contraction strength is moderate.  Contraction frequency is irregular.   Abdomen: Fetal presentation: vertex  Introitus: Normal vulva. Normal vagina.  Pelvis: adequate for delivery.   Cervix: Cervix evaluated by digital exam.     Fetal Exam Fetal Monitor Review: Mode:  ultrasound.   Baseline rate: 120.  Variability: moderate (6-25 bpm).   Pattern: accelerations present and no decelerations.    Fetal State Assessment: Category I - tracings are normal.    Dilation: 3 Effacement (%): 50 Cervical Position: Middle Station: -2 Presentation: Vertex  Physical Exam  Constitutional: She is oriented to person, place, and time. She appears well-developed and well-nourished.  HENT:  Head: Normocephalic and atraumatic.  Eyes: Conjunctivae are normal. No scleral icterus.  Cardiovascular: Normal rate, regular rhythm, normal heart sounds and intact distal pulses.   No murmur heard. Respiratory: Effort normal and breath sounds normal. No respiratory distress. She has no wheezes. She has no rales.  GI: Bowel sounds are normal. She exhibits distension (gravid). There is no tenderness.  Musculoskeletal: She exhibits edema (trace ankle edema).  Lymphadenopathy:    She has no cervical adenopathy.  Neurological: She is alert and oriented to person, place, and time.  Skin: Skin is warm and dry.    Prenatal labs: ABO, Rh: A/POS/-- (07/11 1142) Antibody: NEG (07/11 1142) Rubella:  immune RPR: NON REAC (07/11 1142)  HBsAg: NEGATIVE (07/11 1142)  HIV: NON REACTIVE (07/02 1815)  GBS:   negative  Assessment/Plan: IOL at [redacted]w[redacted]d for oligohydramnios, Bishop's score of 5-6. -will start pitocin    BOOTH, Ahron Hulbert 03/30/2011, 4:43 PM

## 2011-03-30 NOTE — Telephone Encounter (Signed)
Preadmission screen  

## 2011-03-30 NOTE — H&P (Signed)
Agree with above.  Erica [name] 03/30/2011 10:22 PM

## 2011-03-31 ENCOUNTER — Encounter (HOSPITAL_COMMUNITY): Payer: Self-pay | Admitting: *Deleted

## 2011-03-31 ENCOUNTER — Other Ambulatory Visit: Payer: Self-pay | Admitting: Advanced Practice Midwife

## 2011-03-31 DIAGNOSIS — O4100X0 Oligohydramnios, unspecified trimester, not applicable or unspecified: Secondary | ICD-10-CM

## 2011-03-31 LAB — CBC
Hemoglobin: 7.5 g/dL — ABNORMAL LOW (ref 12.0–15.0)
Platelets: 206 10*3/uL (ref 150–400)
RBC: 2.97 MIL/uL — ABNORMAL LOW (ref 3.87–5.11)
WBC: 13.1 10*3/uL — ABNORMAL HIGH (ref 4.0–10.5)

## 2011-03-31 LAB — RPR: RPR Ser Ql: NONREACTIVE

## 2011-03-31 MED ORDER — FERROUS SULFATE 325 (65 FE) MG PO TABS
325.0000 mg | ORAL_TABLET | Freq: Two times a day (BID) | ORAL | Status: DC
  Administered 2011-03-31 – 2011-04-01 (×3): 325 mg via ORAL
  Filled 2011-03-31 (×3): qty 1

## 2011-03-31 MED ORDER — IBUPROFEN 600 MG PO TABS
600.0000 mg | ORAL_TABLET | Freq: Four times a day (QID) | ORAL | Status: DC
  Administered 2011-03-31 – 2011-04-01 (×6): 600 mg via ORAL
  Filled 2011-03-31 (×6): qty 1

## 2011-03-31 MED ORDER — MEASLES, MUMPS & RUBELLA VAC ~~LOC~~ INJ
0.5000 mL | INJECTION | Freq: Once | SUBCUTANEOUS | Status: DC
  Filled 2011-03-31: qty 0.5

## 2011-03-31 MED ORDER — SIMETHICONE 80 MG PO CHEW: 80.0000 mg | CHEWABLE_TABLET | ORAL | Status: DC | PRN

## 2011-03-31 MED ORDER — PRENATAL PLUS 27-1 MG PO TABS
1.0000 | ORAL_TABLET | Freq: Every day | ORAL | Status: DC
  Administered 2011-03-31 – 2011-04-01 (×2): 1 via ORAL
  Filled 2011-03-31 (×2): qty 1

## 2011-03-31 MED ORDER — LANOLIN HYDROUS EX OINT: 1.0000 "application " | TOPICAL_OINTMENT | CUTANEOUS | Status: DC | PRN

## 2011-03-31 MED ORDER — ONDANSETRON HCL 4 MG/2ML IJ SOLN: 4.0000 mg | INTRAMUSCULAR | Status: DC | PRN

## 2011-03-31 MED ORDER — DIPHENHYDRAMINE HCL 25 MG PO CAPS: 25.0000 mg | ORAL_CAPSULE | Freq: Four times a day (QID) | ORAL | Status: DC | PRN

## 2011-03-31 MED ORDER — ZOLPIDEM TARTRATE 5 MG PO TABS: 5.0000 mg | ORAL_TABLET | Freq: Every evening | ORAL | Status: DC | PRN

## 2011-03-31 MED ORDER — BENZOCAINE-MENTHOL 20-0.5 % EX AERO: 1.0000 "application " | INHALATION_SPRAY | CUTANEOUS | Status: DC | PRN

## 2011-03-31 MED ORDER — DIBUCAINE 1 % RE OINT: 1.0000 "application " | TOPICAL_OINTMENT | RECTAL | Status: DC | PRN

## 2011-03-31 MED ORDER — SENNOSIDES-DOCUSATE SODIUM 8.6-50 MG PO TABS
2.0000 | ORAL_TABLET | Freq: Every day | ORAL | Status: DC
  Administered 2011-03-31: 2 via ORAL

## 2011-03-31 MED ORDER — MAGNESIUM HYDROXIDE 400 MG/5ML PO SUSP: 30.0000 mL | ORAL | Status: DC | PRN

## 2011-03-31 MED ORDER — ONDANSETRON HCL 4 MG PO TABS: 4.0000 mg | ORAL_TABLET | ORAL | Status: DC | PRN

## 2011-03-31 MED ORDER — WITCH HAZEL-GLYCERIN EX PADS: 1.0000 "application " | MEDICATED_PAD | CUTANEOUS | Status: DC | PRN

## 2011-03-31 MED ORDER — TETANUS-DIPHTH-ACELL PERTUSSIS 5-2.5-18.5 LF-MCG/0.5 IM SUSP
0.5000 mL | Freq: Once | INTRAMUSCULAR | Status: AC
  Administered 2011-04-01: 0.5 mL via INTRAMUSCULAR
  Filled 2011-03-31: qty 0.5

## 2011-03-31 NOTE — Progress Notes (Signed)
UR Chart review completed.  

## 2011-03-31 NOTE — Progress Notes (Signed)
Post Partum Day 1  Subjective: no complaints, up ad lib, voiding and tolerating PO  Objective: Blood pressure 118/73, pulse 59, temperature 97.9 F (36.6 C), temperature source Oral, resp. rate 18, height 5\' 5"  (1.651 m), weight 78.019 kg (172 lb), last menstrual period 06/18/2010, unknown if currently breastfeeding.  Physical Exam:  General: alert and no distress Lochia: appropriate Uterine Fundus: firm Incision: NA DVT Evaluation: Negative Homan's sign.   Basename 03/31/11 0520 03/30/11 1745  HGB 7.5* 8.1*  HCT 24.3* 26.2*    Assessment/Plan: Plan for discharge tomorrow, Breastfeeding and Contraception Undecided   LOS: 1 day   Ason Heslin 03/31/2011, 8:02 AM

## 2011-04-01 ENCOUNTER — Inpatient Hospital Stay (HOSPITAL_COMMUNITY): Admission: RE | Admit: 2011-04-01 | Payer: Medicaid Other | Source: Ambulatory Visit

## 2011-04-01 MED ORDER — IBUPROFEN 600 MG PO TABS: 600.0000 mg | ORAL_TABLET | Freq: Four times a day (QID) | ORAL | Status: AC

## 2011-04-01 NOTE — Progress Notes (Signed)
Post Partum Day 2 Subjective: no complaints, up ad lib, voiding and tolerating PO  Objective: Blood pressure 121/72, pulse 67, temperature 97.9 F (36.6 C), temperature source Oral, resp. rate 18, height 5\' 5"  (1.651 m), weight 78.019 kg (172 lb), last menstrual period 06/18/2010, unknown if currently breastfeeding.  Physical Exam:  General: alert, cooperative and no distress Lochia: appropriate Uterine Fundus: firm Incision:  DVT Evaluation: No evidence of DVT seen on physical exam.   Basename 03/31/11 0520 03/30/11 1745  HGB 7.5* 8.1*  HCT 24.3* 26.2*    Assessment/Plan: Discharge home Baby in NICU   LOS: 2 days   [name],Erica Surges 04/01/2011, 10:09 AM

## 2011-04-01 NOTE — Discharge Summary (Signed)
  Obstetric Discharge Summary Reason for Admission: onset of labor Prenatal Procedures: ultrasound Intrapartum Procedures: spontaneous vaginal delivery Postpartum Procedures: none Complications-Operative and Postpartum: none   Delivery Note At 11:41 PM a viable female was delivered via Vaginal, Spontaneous Delivery (Presentation: Left Occiput Anterior).  APGAR: 8, 9; weight 7 lb 8.8 oz (3425 g).   Placenta status: Intact, Spontaneous.  Cord: 3 vessels with the following complications: None.  Cord pH:   Anesthesia: None  Episiotomy: None Lacerations: None Suture Repair:  Est. Blood Loss (mL):   Mom to postpartum.  Baby to NICU.  [name],Erica [name] 04/01/2011, 10:10 AM     H/H: Lab Results  Component Value Date/Time   HGB 7.5* 03/31/2011  5:20 AM   HCT 24.3* 03/31/2011  5:20 AM      Discharge Diagnoses: Term Pregnancy-delivered  Discharge Information: Date: 02/16/2011 Activity: pelvic rest Diet: routine Medications: Ibuprophen Breast feeding:  Yes Condition: stable Instructions: refer to practice specific booklet Discharge to: home; plans Implanon   [name],Erica [name] 04/01/2011,10:10 AM

## 2011-04-02 NOTE — Discharge Summary (Signed)
Agree with above note.  Erica [name] 04/02/2011 6:04 AM

## 2011-05-04 ENCOUNTER — Ambulatory Visit: Payer: Medicaid Other | Admitting: Family Medicine

## 2011-05-25 ENCOUNTER — Ambulatory Visit: Payer: Medicaid Other | Admitting: Physician Assistant

## 2011-06-08 ENCOUNTER — Ambulatory Visit (INDEPENDENT_AMBULATORY_CARE_PROVIDER_SITE_OTHER): Payer: Medicaid Other | Admitting: Advanced Practice Midwife

## 2011-06-08 ENCOUNTER — Encounter: Payer: Self-pay | Admitting: Advanced Practice Midwife

## 2011-06-08 DIAGNOSIS — O9902 Anemia complicating childbirth: Secondary | ICD-10-CM

## 2011-06-08 DIAGNOSIS — Z3049 Encounter for surveillance of other contraceptives: Secondary | ICD-10-CM

## 2011-06-08 DIAGNOSIS — O9081 Anemia of the puerperium: Secondary | ICD-10-CM

## 2011-06-08 LAB — POCT PREGNANCY, URINE: Preg Test, Ur: NEGATIVE

## 2011-06-08 MED ORDER — MEDROXYPROGESTERONE ACETATE 150 MG/ML IM SUSP
150.0000 mg | Freq: Once | INTRAMUSCULAR | Status: AC
Start: 2011-06-08 — End: 2011-06-08
  Administered 2011-06-08: 150 mg via INTRAMUSCULAR

## 2011-06-08 NOTE — Progress Notes (Signed)
  Subjective:     Erica [name] is a 30 y.o. female who presents for a postpartum visit. She is 8 week postpartum following a spontaneous vaginal delivery. I have fully reviewed the prenatal and intrapartum course. The delivery was at 40.5 gestational weeks. Outcome: spontaneous vaginal delivery. Anesthesia: epidural. Postpartum course has been uncomplicated. Baby's course has been complicated by NICU stay for respiratory distress and abdominal distension. She has been discharges and is healthy Baby is feeding by both breast and bottle - . Bleeding no bleeding. Bowel function is normal. Bladder function is normal. Patient is not sexually active. Contraception method is none. Postpartum depression screening: negative.  The following portions of the patient's history were reviewed and updated as appropriate: allergies, current medications, past family history, past medical history, past social history, past surgical history and problem list.  Review of Systems A comprehensive review of systems was negative.   Objective:    BP 117/67  Pulse 67  Temp(Src) 96.7 F (35.9 C) (Oral)  Ht 5\' 5"  (1.651 m)  Wt 164 lb 1.6 oz (74.435 kg)  BMI 27.31 kg/m2  Breastfeeding? Yes  General:  alert, cooperative and no distress   Breasts:  inspection negative, no nipple discharge or bleeding, no masses or nodularity palpable  Lungs: clear to auscultation bilaterally  Heart:  regular rate and rhythm, S1, S2 normal, no murmur, click, rub or gallop  Abdomen: soft, non-tender; bowel sounds normal; no masses,  no organomegaly   Vulva:  normal  Vagina: normal vagina and unable to perform kagel  Cervix:  normal by palpation.  Corpus: normal size, contour, position, consistency, mobility, non-tender  Adnexa:  normal adnexa  Rectal Exam: Not performed.        Assessment:    Normal postpartum exam. Pap smear not done at today's visit.  Anemia of pregnancy. Asymptomatic today  Plan:    1. Contraception:  Depo-Provera injections 2. Contraception counseling adn BF teaching done 3. Follow up in: 12 weeks or as needed.  4. CBC  Kwana Ringel 06/08/2011 2:59 PM

## 2011-06-08 NOTE — Patient Instructions (Addendum)
Medroxyprogesterone acetate: Patient drug information  Copyright [phone] [employer] All rights reserved.  (For additional information see "Medroxyprogesterone acetate: Drug information" and see "Medroxyprogesterone acetate: Pediatric drug information" ) Brand Names: [place]  Depo-Provera;  Depo-Provera Contraceptive;  depo-subQ provera 104;  Provera Brand Names: [place]  Alti-MPA;  Apo-Medroxy;  Depo-Prevera;  Depo-Provera;  Dom-Medroxyprogesterone;  Gen-Medroxy;  Medroxy;  Medroxyprogesterone Acetate Injectable Suspension USP;  Novo-Medrone;  PMS-Medroxyprogesterone;  Provera-Pak;  Provera;  Teva-Medroxyprogesterone What key warnings do I need to know about before using this drug?   HYQ:MVHQ:I696:E9528413 Do not take this drug during the first 4 months of your pregnancy. Progestins may cause birth defects. Call your doctor if you think you may be pregnant. This drug does not stop the spread of diseases caused by having sex.   KGM:WNUU:V253:G6440347 Women taking this drug for birth control may lose bone. Bone loss is greater the longer the drug is used. It is not known what the effects will be on bones when used in teenage and young adult women.   QQV:ZDGL:O756:E3329518 Do not take estrogens to stop heart disease or dementia. Using estrogens may raise your chances of having a heart attack, a stroke, breast cancer, or a blood clot.   ACZ:YSAY:T016:W1093235 This drug does not protect the body from HIV infection or other diseases caused by having sex.   TDD:UKGU:R427:C6237628 Sometimes drugs are not safe when you take them with certain other drugs. Taking them together can cause bad side effects. This is one of those drugs. Be sure to talk to your doctor about all the drugs you take. When is it not safe to use this drug?   BTD:VVOH:Y073:X1062694 If you have an allergy to medroxyprogesterone or any other part of this drug.   WNI:OEVO:J500:X3818299 Tell your doctor if you  are allergic to any drugs. Make sure to tell about the allergy and what signs you had. This includes telling about rash; hives; itching; shortness of breath; wheezing; cough; swelling of face, lips, tongue, or throat; or any other signs.   BZJ:IRCV:E938:B0175102 If you have any of these health problems: Blood clots, liver disease, stroke, or vaginal bleeding.   HEN:IDPO:E423:N3614431 If you are pregnant or may be pregnant. What is this drug used for?   VQM:GQQP:Y195:K9326712 It is used to stop pregnancy.   WPY:KDXI:P382:N0539767 It is used to stop endometrial changes in women after change of life who are getting estrogen therapy.   HAL:PFXT:K240:X7353299 It is used to stop pain caused by endometriosis.   MEQ:ASTM:H962:I2979892 It is used to treat uterine bleeding due to hormonal imbalance.   JJH:ERDE:Y814:G8185631 It is used to treat endometrial cancer.   SHF:WYOV:Z858:I5027741 It is used to treat women who do not have a monthly period cycle. How does this drug work?   OIN:OMVE:H209:O7096283 Progestins are made by the body and are used by the milk-making glands and to help the period (menstrual) cycle.   MOQ:HUTM:L465:K3546568 Medroxyprogesterone stops egg growth and egg release (ovulation) to avoid pregnancy. How is this drug best taken?   LEX:NTZG:Y174:B4496759 Follow the diet and workout plan that your doctor told you about.  FMB:WGYK:Z993:T7017793 Oral:   JQZ:ESPQ:Z300:T6226333 Take tablet with or without food. Take with food if it causes an upset stomach.  LKT:GYBW:L893:T3428768 Shot:   TLX:BWIO:M355:H7416384 It is given as a shot into a muscle. Depo-subQ provera 104 is given as a shot into the fatty part of the skin. What do I do if I miss a dose? (does not apply to patients in the hospital)  TXM:IWOE:H212:Y4825003 Oral:  UJW:JXBJ:Y782:N5621308 Take a missed dose as soon as you think about it.   MVH:QION:G295:M8413244 If it is close to the time for your next dose, skip  the missed dose and go back to your normal time.   WNU:UVOZ:D664:Q0347425 Do not take 2 doses at the same time or extra doses.  ZDG:LOVF:I433:I9518841 Shot:   YSA:YTKZ:S010:X3235573 Call your doctor to find out what to do. Are there any precautions when using this drug?   UKG:URKY:H062:B7628315 Keep a list of all your drugs (prescription, natural products, vitamins, OTC) with you. Give this list to your doctor.   VVO:HYWV:P710:G2694854 Avoid cigarette smoking.   OEV:OJJK:K938:H8299371 If you have breast or any genital cancer, talk with your doctor.   IRC:VELF:Y101:B5102585 If you have any blood flow problems, talk with your doctor.   IDP:OEUM:P536:R4431540 Have your blood pressure checked often. Talk with your doctor.   GQQ:PYPP:J093:O6712458 Have a bone density test. Talk with your doctor.   KDX:IPJA:S505:L9767341 Do monthly breast self-exams and have a gynecologic exam every year.   PFX:TKWI:O973:Z3299242 Check all drugs you are taking with your doctor. This drug may not mix well with some other drugs.   AST:MHDQ:Q229:N9892119 Tell your doctor if you are breast-feeding. What are some side effects of this drug?   ERD:EYCX:K481:E5631497 Weight gain.   WYO:VZCH:Y850:Y7741287 Weak bones with long-term use.   OMV:EHMC:N470:J6283662 Headache.   HUT:MLYY:T035:W6568127 Feeling tired or weak.   NTZ:GYFV:C944:H6759163 Belly pain.   WGY:KZLD:J570:V7793903 More hungry.   ESP:QZRA:Q762:U6333545 Swelling.   GYB:WLSL:H734:K8768115 Period (menstrual) changes. These include lots of bleeding, spotting, or bleeding between cycles. When do I need to call my doctor?   BWI:OMBT:D974:B6384536 If you think there was an overdose, call your local poison control center or ER right away.   IWO:EHOZ:Y248:G5003704 Signs of a very bad reaction to the drug. These include wheezing; chest tightness; fever; itching; bad cough; blue or gray skin color; seizures; or swelling of face, lips, tongue, or  throat.   UGQ:BVQX:I503:U8828003 Chest pain or pressure.   KJZ:PHXT:A569:V9480165 Trouble breathing.   VVZ:SMOL:M786:L5449201 Swelling, warmth, or pain in the leg or arm.   EOF:HQRF:X588:T2549826 Any rash.   EBR:AXEN:M076:K0881103 Side effect or health problem is not better or you are feeling worse. How do I store and/or throw out this drug?   PRX:YVOP:F292:K4628638 Store tablets at room temperature.   TRR:NHAF:B903:Y3338329 Protect tablets from water. Do not store in a bathroom or kitchen.   VBT:YOMA:Y045:T9774142 The shot will be given to you in a hospital or doctor's office. You will not store it at home. General drug facts   LTR:VUYE:B343:H6861683 If you have a very bad allergy, wear an allergy ID at all times.   FGB:MSXJ:D552:C8022336 Do not share your drugs with others and do not take anyone else's drugs.   PQA:ESLP:N300:F1102111 Keep all drugs out of the reach of children and pets.   NBV:APOL:I103:U1314388 Most drugs may be thrown away in household trash after mixing with coffee grounds or kitty litter and sealing in a plastic bag.   ILN:ZVJK:Q206:O1561537 In [place], take any unused drugs to the pharmacy. Also, visit http://www.[facility].gc.ca/hl-vs/iyh-vsv/med/disposal-defaire-eng.php#th to learn about the right way to get rid of unused drugs.   HKF:EXMD:Y709:K9574734 Keep a list of all your drugs (prescription, natural products, vitamins, OTC) with you. Give this list to your doctor.   YZJ:QDUK:R838:F8403754 Call your doctor for help with any side effects. If in the [place] you may also call the FDA at 1-800-FDA-1088 or if in [place], you may also call [employer]'s Vigilance Program at 813-348-4394.   TCY:ELYH:T093:J1216244 Talk  with the doctor before starting any new drug, including OTC, natural products, or vitamins. Use of UpToDate is subject to the Subscription and License Agreement.

## 2011-06-08 NOTE — Progress Notes (Signed)
NST reactive from 04-30-11

## 2011-06-09 LAB — CBC
HCT: 35.3 % — ABNORMAL LOW (ref 36.0–46.0)
Hemoglobin: 10.1 g/dL — ABNORMAL LOW (ref 12.0–15.0)
MCHC: 28.6 g/dL — ABNORMAL LOW (ref 30.0–36.0)
RBC: 4.51 MIL/uL (ref 3.87–5.11)
WBC: 5.8 10*3/uL (ref 4.0–10.5)

## 2011-08-24 ENCOUNTER — Ambulatory Visit: Payer: Medicaid Other

## 2011-08-28 ENCOUNTER — Ambulatory Visit: Payer: Self-pay

## 2011-11-23 ENCOUNTER — Emergency Department (HOSPITAL_COMMUNITY): Admission: EM | Admit: 2011-11-23 | Discharge: 2011-11-23 | Payer: Self-pay

## 2012-06-20 ENCOUNTER — Encounter (HOSPITAL_COMMUNITY): Payer: Self-pay

## 2012-06-20 ENCOUNTER — Emergency Department (HOSPITAL_COMMUNITY): Payer: Medicaid Other

## 2012-06-20 ENCOUNTER — Emergency Department (HOSPITAL_COMMUNITY)
Admission: EM | Admit: 2012-06-20 | Discharge: 2012-06-20 | Disposition: A | Discharge status: Home / Self Care | Payer: Medicaid Other | Attending: Emergency Medicine | Admitting: Emergency Medicine

## 2012-06-20 DIAGNOSIS — M25539 Pain in unspecified wrist: Secondary | ICD-10-CM | POA: Insufficient documentation

## 2012-06-20 DIAGNOSIS — Z87891 Personal history of nicotine dependence: Secondary | ICD-10-CM | POA: Insufficient documentation

## 2012-06-20 MED ORDER — HYDROCODONE-ACETAMINOPHEN 5-325 MG PO TABS
1.0000 | ORAL_TABLET | Freq: Once | ORAL | Status: AC
  Administered 2012-06-20: 1 via ORAL
  Filled 2012-06-20: qty 1

## 2012-06-20 MED ORDER — HYDROCODONE-ACETAMINOPHEN 5-325 MG PO TABS: ORAL_TABLET | ORAL | Status: DC

## 2012-06-20 MED ORDER — MELOXICAM 7.5 MG PO TABS: 7.5000 mg | ORAL_TABLET | Freq: Every day | ORAL | Status: DC

## 2012-06-20 NOTE — Progress Notes (Signed)
Orthopedic Tech Progress Note Patient Details:  Erica [name] Feb 08, 1981 [id number] Left velcro wrist splint applied, tolerated well Ortho Devices Type of Ortho Device: Velcro wrist splint Ortho Device/Splint Location: Left  Ortho Device/Splint Interventions: Application   Asia R Thompson 06/20/2012, 10:37 AM

## 2012-06-20 NOTE — ED Provider Notes (Signed)
History     CSN: [id number]  Arrival date & time 06/20/12  0911   First MD Initiated Contact with Patient 06/20/12 [phone]      Chief Complaint  Patient presents with  . Wrist Pain    (Consider location/radiation/quality/duration/timing/severity/associated sxs/prior treatment) HPI Comments: Patient presents with complaint of left wrist pain that has been chronic for the past 5 years however has been worse over the past one week. Patient denies new injuries. She has not had other joint pains. She denies fever, vomiting. Patient is taking Tylenol at home but has not helped. Pain is worse with any movement of her left wrist. She denies numbness, tingling, weakness in her hand or fingers. Onset gradual. Course is constant. Nothing makes symptoms better.  Patient is a 31 y.o. female presenting with wrist pain. The history is provided by the patient.  Wrist Pain This is a chronic problem. The current episode started in the past 7 days. The problem occurs constantly. The problem has been unchanged. Associated symptoms include arthralgias. Pertinent negatives include no fever, joint swelling, neck pain, numbness or weakness.    Past Medical History  Diagnosis Date  . No pertinent past medical history   . Abnormal Pap smear     Past Surgical History  Procedure Date  . No past surgeries     Family History  Problem Relation Age of Onset  . Arthritis Mother   . Hypertension Mother   . Diabetes Mother   . Asthma Mother   . Arthritis Father   . Hypertension Father   . Diabetes Father     History  Substance Use Topics  . Smoking status: Former Smoker -- 1.0 packs/day for .5 years    Quit date: 06/24/2010  . Smokeless tobacco: Never Used  . Alcohol Use: No    OB History    Grav Para Term Preterm Abortions TAB SAB Ect Mult Living   3 2 2  1  1   2       Review of Systems  Constitutional: Negative for fever.  HENT: Negative for neck pain.   Musculoskeletal: Positive for  arthralgias. Negative for back pain and joint swelling.  Skin: Negative for wound.  Neurological: Negative for weakness and numbness.    Allergies  Cholestatin  Home Medications  No current outpatient prescriptions on file.  Breastfeeding? Yes  Physical Exam  Nursing note and vitals reviewed. Constitutional: She appears well-developed and well-nourished.  HENT:  Head: Normocephalic and atraumatic.  Eyes: Pupils are equal, round, and reactive to light.  Neck: Normal range of motion. Neck supple.  Cardiovascular: Exam reveals no decreased pulses.   Musculoskeletal: She exhibits tenderness. She exhibits no edema.       Left elbow: Normal.       Left wrist: She exhibits decreased range of motion, tenderness and bony tenderness. She exhibits no swelling.       Left forearm: Normal.       Left hand: Normal. normal sensation noted. Normal strength noted.  Neurological: She is alert. No sensory deficit.       Motor, sensation, and vascular distal to the injury is fully intact.   Skin: Skin is warm and dry.  Psychiatric: She has a normal mood and affect.    ED Course  Procedures (including critical care time)  Labs Reviewed - No data to display Dg Wrist Complete Left  06/20/2012  *RADIOLOGY REPORT*  Clinical Data: Pain.  No injury.  LEFT WRIST - COMPLETE 3+ VIEW  Comparison: None.  Findings: No fracture or bone lesion.  The joints are normally spaced and aligned.  The soft tissues are unremarkable.  IMPRESSION: Normal left wrist radiographs   Original Report Authenticated By: Amie Portland, M.D.      1. Wrist pain     9:26 AM Patient seen and examined. Work-up initiated. Medications ordered.   Vital signs reviewed and are as follows: Filed Vitals:   06/20/12 0958  BP: 111/73  Pulse: 75  Temp: 98 F (36.7 C)  Resp: 16   10:17 AM Radiologist report reviewed. No acute process.   Splint by ortho tech.   Rx NSAID and pain medication.   Patient counseled on use of  narcotic pain medications. Counseled not to combine these medications with others containing tylenol. Urged not to drink alcohol, drive, or perform any other activities that requires focus while taking these medications. The patient verbalizes understanding and agrees with the plan.  Patient was counseled on RICE protocol and told to rest injury, use ice for no longer than 15 minutes every hour, compress the area, and elevate above the level of their heart as much as possible to reduce swelling.  Questions answered.  Patient verbalized understanding.      MDM  Wrist pain, no fracture. No h/o gout. No concern for septic joint or cellulitis. Will treat for inflammation -- ortho f/u given if needed.        Renne Crigler, [place] 06/20/12 1018

## 2012-06-20 NOTE — ED Notes (Signed)
Patient has had pain in left wrist for 5 years, but the pain has increased in agitation over the last couple of days.

## 2012-06-21 NOTE — ED Provider Notes (Signed)
Medical screening examination/treatment/procedure(s) were performed by non-physician practitioner and as supervising physician I was immediately available for consultation/collaboration.  Aleeyah Bensen, MD 06/21/12 0851 

## 2012-07-08 ENCOUNTER — Emergency Department (HOSPITAL_COMMUNITY)
Admission: EM | Admit: 2012-07-08 | Discharge: 2012-07-08 | Disposition: A | Discharge status: Home / Self Care | Payer: Medicaid Other

## 2013-04-17 ENCOUNTER — Encounter: Payer: Self-pay | Admitting: Obstetrics

## 2013-04-24 ENCOUNTER — Other Ambulatory Visit: Payer: Self-pay | Admitting: Internal Medicine

## 2013-04-24 DIAGNOSIS — N926 Irregular menstruation, unspecified: Secondary | ICD-10-CM

## 2013-04-28 ENCOUNTER — Encounter (HOSPITAL_COMMUNITY): Payer: Self-pay

## 2013-04-28 ENCOUNTER — Emergency Department (HOSPITAL_COMMUNITY)
Admission: EM | Admit: 2013-04-28 | Discharge: 2013-04-28 | Disposition: A | Discharge status: Home / Self Care | Payer: Medicaid Other | Attending: Emergency Medicine | Admitting: Emergency Medicine

## 2013-04-28 DIAGNOSIS — M542 Cervicalgia: Secondary | ICD-10-CM | POA: Insufficient documentation

## 2013-04-28 DIAGNOSIS — Z79899 Other long term (current) drug therapy: Secondary | ICD-10-CM | POA: Insufficient documentation

## 2013-04-28 DIAGNOSIS — Z87891 Personal history of nicotine dependence: Secondary | ICD-10-CM | POA: Insufficient documentation

## 2013-04-28 DIAGNOSIS — R509 Fever, unspecified: Secondary | ICD-10-CM | POA: Insufficient documentation

## 2013-04-28 MED ORDER — IBUPROFEN 800 MG PO TABS: 800.0000 mg | ORAL_TABLET | Freq: Three times a day (TID) | ORAL | Status: DC

## 2013-04-28 MED ORDER — HYDROCODONE-ACETAMINOPHEN 5-325 MG PO TABS: 1.0000 | ORAL_TABLET | Freq: Four times a day (QID) | ORAL | Status: DC | PRN

## 2013-04-28 MED ORDER — METHOCARBAMOL 500 MG PO TABS: 1000.0000 mg | ORAL_TABLET | Freq: Two times a day (BID) | ORAL | Status: DC

## 2013-04-28 NOTE — ED Provider Notes (Signed)
CSN: [id number]     Arrival date & time 04/28/13  1149 History  This chart was scribed for non-physician practitioner Arthor Captain, PA-C, working with Suzi Roots, MD by Dorothey Baseman, ED Scribe. This patient was seen in room WTR5/WTR5 and the patient's care was started at 12:39 PM.    Chief Complaint  Patient presents with  . Torticollis   The history is provided by the patient. No language interpreter was used.   HPI Comments: Erica [name] is a 32 y.o. female who presents to the Emergency Department complaining of neck stiffness onset 2 weeks ago that has been progressively worsening and is exacerbated with movement. She reports feeling 2 "knots" to the back of the neck and states that she has not been able to move her neck secondary to pain. She reports associated hot and cold flashes and a subjective fever. She denies any potential injury to the area. She reports that she has had similar symptoms in the past, but states that it has usually subsided in 1-2 days. She reports applying Bengay and warm compresses without relief. She denies headache, numbness, weakness, paresthesias, or rashes.   Past Medical History  Diagnosis Date  . No pertinent past medical history   . Abnormal Pap smear    Past Surgical History  Procedure Laterality Date  . No past surgeries     Family History  Problem Relation Age of Onset  . Arthritis Mother   . Hypertension Mother   . Diabetes Mother   . Asthma Mother   . Arthritis Father   . Hypertension Father   . Diabetes Father    History  Substance Use Topics  . Smoking status: Former Smoker -- 1.00 packs/day for .5 years    Quit date: 06/24/2010  . Smokeless tobacco: Never Used  . Alcohol Use: No   OB History   Grav Para Term Preterm Abortions TAB SAB Ect Mult Living   3 2 2  1  1   2      Review of Systems  Constitutional: Fever:  subjective.  HENT: Positive for neck pain and neck stiffness.   Eyes: Negative for photophobia.   Gastrointestinal: Negative for nausea and vomiting.  Skin: Negative for rash.  Neurological: Negative for weakness, numbness and headaches.  All other systems reviewed and are negative.    Allergies  Review of patient's allergies indicates no known allergies.  Home Medications   Current Outpatient Rx  Name  Route  Sig  Dispense  Refill  . acetaminophen (TYLENOL) 500 MG tablet   Oral   Take 500 mg by mouth every 6 (six) hours as needed. As needed for wrist pain.         Marland Kitchen HYDROcodone-acetaminophen (NORCO/VICODIN) 5-325 MG per tablet      Take 1-2 tablets every 6 hours as needed for severe pain   8 tablet   0   . meloxicam (MOBIC) 7.5 MG tablet   Oral   Take 1 tablet (7.5 mg total) by mouth daily.   10 tablet   0    Triage Vitals: BP 117/78  Pulse 82  Temp(Src) 98.9 F (37.2 C) (Oral)  Resp 17  SpO2 98%  Physical Exam  Nursing note and vitals reviewed. Constitutional: She is oriented to person, place, and time. She appears well-developed and well-nourished. No distress.  Patient sitting stiffly upright. Guarding movement of her neck. She turns her head with her torso.  HENT:  Head: Normocephalic and atraumatic.  Eyes: Conjunctivae  are normal.  Neck: Neck supple.  Extremely tender to palpation to suboccipital muscles and cervical paraspinal muscles. Limited range of motion due to pain.   Musculoskeletal: Normal range of motion.  Neurological: She is alert and oriented to person, place, and time.  No meningismus.  Speech is clear and goal oriented, follows commands Major Cranial nerves without deficit, no facial droop Normal strength in upper and lower extremities bilaterally including dorsiflexion and plantar flexion, strong and equal grip strength Sensation normal to light and sharp touch Moves extremities without ataxia, coordination intact Normal finger to nose and rapid alternating movements Normal gait    Skin: Skin is warm and dry.  Psychiatric: She  has a normal mood and affect. Her behavior is normal.    ED Course  Procedures (including critical care time)  DIAGNOSTIC STUDIES: Oxygen Saturation is 98% on room air, normal by my interpretation.    COORDINATION OF CARE: 12:45PM- Will discharge patient with ibuprofen, Norco, and Robaxin to manage pain symptoms. Discussed treatment plan with patient at bedside and patient verbalized agreement.     Labs Review Labs Reviewed - No data to display Imaging Review No results found.  MDM   1. Musculoskeletal neck pain    Patient with overt neck spasm and tenderness. RF include obesity and large, pendulous breast. History of similar neck pain. No injury. No concern for meningitis.  I personally performed the services described in this documentation, which was scribed in my presence. The recorded information has been reviewed and is accurate.    Arthor Captain, PA-C 04/30/13 1754

## 2013-04-28 NOTE — ED Notes (Signed)
Per [facility] pt c/o stiff neck x2wks, no injury or trauma

## 2013-05-02 NOTE — ED Provider Notes (Signed)
Medical screening examination/treatment/procedure(s) were performed by non-physician practitioner and as supervising physician I was immediately available for consultation/collaboration.   Suzi Roots, MD 05/02/13 1023

## 2013-05-05 ENCOUNTER — Other Ambulatory Visit: Payer: Medicaid Other

## 2013-05-06 ENCOUNTER — Other Ambulatory Visit: Payer: Medicaid Other

## 2013-05-08 ENCOUNTER — Ambulatory Visit
Admission: RE | Admit: 2013-05-08 | Discharge: 2013-05-08 | Disposition: A | Discharge status: Home / Self Care | Payer: Medicaid Other | Source: Ambulatory Visit | Attending: Internal Medicine | Admitting: Internal Medicine

## 2013-05-08 DIAGNOSIS — N926 Irregular menstruation, unspecified: Secondary | ICD-10-CM

## 2013-05-29 ENCOUNTER — Ambulatory Visit: Payer: Self-pay | Admitting: Obstetrics

## 2013-12-11 ENCOUNTER — Emergency Department (HOSPITAL_COMMUNITY)
Admission: EM | Admit: 2013-12-11 | Discharge: 2013-12-11 | Disposition: A | Discharge status: Home / Self Care | Payer: Medicaid Other | Attending: Emergency Medicine | Admitting: Emergency Medicine

## 2013-12-11 ENCOUNTER — Encounter (HOSPITAL_COMMUNITY): Payer: Self-pay | Admitting: Emergency Medicine

## 2013-12-11 DIAGNOSIS — Z3202 Encounter for pregnancy test, result negative: Secondary | ICD-10-CM | POA: Insufficient documentation

## 2013-12-11 DIAGNOSIS — R252 Cramp and spasm: Secondary | ICD-10-CM

## 2013-12-11 DIAGNOSIS — M62838 Other muscle spasm: Secondary | ICD-10-CM | POA: Insufficient documentation

## 2013-12-11 DIAGNOSIS — F17200 Nicotine dependence, unspecified, uncomplicated: Secondary | ICD-10-CM | POA: Insufficient documentation

## 2013-12-11 LAB — POC URINE PREG, ED: Preg Test, Ur: NEGATIVE

## 2013-12-11 MED ORDER — CYCLOBENZAPRINE HCL 10 MG PO TABS: 10.0000 mg | ORAL_TABLET | Freq: Two times a day (BID) | ORAL | Status: DC | PRN

## 2013-12-11 NOTE — Discharge Instructions (Signed)
°  Muscle Cramps and Spasms °Muscle cramps and spasms occur when a muscle or muscles tighten and you have no control over this tightening (involuntary muscle contraction). They are a common problem and can develop in any muscle. The most common place is in the calf muscles of the leg. Both muscle cramps and muscle spasms are involuntary muscle contractions, but they also have differences:  °· Muscle cramps are sporadic and painful. They may last a few seconds to a quarter of an hour. Muscle cramps are often more forceful and last longer than muscle spasms. °· Muscle spasms may or may not be painful. They may also last just a few seconds or much longer. °CAUSES  °It is uncommon for cramps or spasms to be due to a serious underlying problem. In many cases, the cause of cramps or spasms is unknown. Some common causes are:  °· Overexertion.   °· Overuse from repetitive motions (doing the same thing over and over).   °· Remaining in a certain position for a long period of time.   °· Improper preparation, form, or technique while performing a sport or activity.   °· Dehydration.   °· Injury.   °· Side effects of some medicines.   °· Abnormally low levels of the salts and ions in your blood (electrolytes), especially potassium and calcium. This could happen if you are taking water pills (diuretics) or you are pregnant.   °Some underlying medical problems can make it more likely to develop cramps or spasms. These include, but are not limited to:  °· Diabetes.   °· Parkinson disease.   °· Hormone disorders, such as thyroid problems.   °· Alcohol abuse.   °· Diseases specific to muscles, joints, and bones.   °· Blood vessel disease where not enough blood is getting to the muscles.   °HOME CARE INSTRUCTIONS  °· Stay well hydrated. Drink enough water and fluids to keep your urine clear or pale yellow. °· It may be helpful to massage, stretch, and relax the affected muscle. °· For tight or tense muscles, use a warm towel, heating  pad, or hot shower water directed to the affected area. °· If you are sore or have pain after a cramp or spasm, applying ice to the affected area may relieve discomfort. °· Put ice in a plastic bag. °· Place a towel between your skin and the bag. °· Leave the ice on for 15-20 minutes, 03-04 times a day. °· Medicines used to treat a known cause of cramps or spasms may help reduce their frequency or severity. Only take over-the-counter or prescription medicines as directed by your caregiver. °SEEK MEDICAL CARE IF:  °Your cramps or spasms get more severe, more frequent, or do not improve over time.  °MAKE SURE YOU:  °· Understand these instructions. °· Will watch your condition. °· Will get help right away if you are not doing well or get worse. °Document Released: 01/13/2002 Document Revised: 11/18/2012 Document Reviewed: 07/10/2012 °[employer]® Patient Information ©2014 [employer]. ° ° °

## 2013-12-11 NOTE — ED Notes (Signed)
Pt in via EMS to triage c/o muscle spasms to neck, history of same, requesting muscle relaxer's and further pain medication as given in the past, ambulatory without distress

## 2013-12-11 NOTE — ED Provider Notes (Signed)
CSN: [id number]     Arrival date & time 12/11/13  1901 History   First MD Initiated Contact with Patient 12/11/13 1932     Chief Complaint  Patient presents with  . Spasms     (Consider location/radiation/quality/duration/timing/severity/associated sxs/prior Treatment) HPI Comments: Patient presents to the emergency department with chief complaint of muscles bowels him. She states that she has had muscle spasms in her neck for the past 4 days. She reports a history of the same. She states that she normally takes Vicodin, and a muscle relaxer with good relief. She denies any fevers chills. Denies any weakness in upper or lower extremities. There no aggravating or alleviating factors.  The history is provided by the patient. No language interpreter was used.    Past Medical History  Diagnosis Date  . No pertinent past medical history   . Abnormal Pap smear    Past Surgical History  Procedure Laterality Date  . No past surgeries     Family History  Problem Relation Age of Onset  . Arthritis Mother   . Hypertension Mother   . Diabetes Mother   . Asthma Mother   . Arthritis Father   . Hypertension Father   . Diabetes Father    History  Substance Use Topics  . Smoking status: Current Some Day Smoker -- 1.00 packs/day for .5 years  . Smokeless tobacco: Never Used  . Alcohol Use: No   OB History   Grav Para Term Preterm Abortions TAB SAB Ect Mult Living   3 2 2  1  1   2      Review of Systems  Constitutional: Negative for fever and chills.  Gastrointestinal:       No bowel incontinence  Genitourinary:       No urinary incontinence  Musculoskeletal: Positive for arthralgias, back pain and myalgias.  Neurological:       No saddle anesthesia      Allergies  Review of patient's allergies indicates no known allergies.  Home Medications   Prior to Admission medications   Medication Sig Start Date End Date Taking? Authorizing Provider  cyclobenzaprine (FLEXERIL) 10 MG  tablet Take 1 tablet (10 mg total) by mouth 2 (two) times daily as needed for muscle spasms. 12/11/13   Roxy Horsemanobert Indica Marcott, PA-C   BP 120/78  Pulse 74  Temp(Src) 99.3 F (37.4 C) (Oral)  Resp 18  Ht 5\' 5"  (1.651 m)  Wt 180 lb 4 oz (81.761 kg)  BMI 30.00 kg/m2  SpO2 100%  LMP 12/05/2013 Physical Exam  Nursing note and vitals reviewed. Constitutional: She is oriented to person, place, and time. She appears well-developed and well-nourished. No distress.  HENT:  Head: Normocephalic and atraumatic.  Eyes: Conjunctivae and EOM are normal. Right eye exhibits no discharge. Left eye exhibits no discharge. No scleral icterus.  Neck: Normal range of motion. Neck supple. No tracheal deviation present.  Cardiovascular: Normal rate, regular rhythm and normal heart sounds.  Exam reveals no gallop and no friction rub.   No murmur heard. Pulmonary/Chest: Effort normal and breath sounds normal. No respiratory distress. She has no wheezes.  Abdominal: Soft. She exhibits no distension. There is no tenderness.  Musculoskeletal: Normal range of motion.  Cervical paraspinal muscles tender to palpation, no bony tenderness, step-offs, or gross abnormality or deformity of spine, patient is able to ambulate, moves all extremities   Neurological: She is alert and oriented to person, place, and time.  Sensation and strength intact bilaterally  Skin: Skin is warm. She is not diaphoretic.  Psychiatric: She has a normal mood and affect. Her behavior is normal. Judgment and thought content normal.    ED Course  Procedures (including critical care time) Labs Review Labs Reviewed  POC URINE PREG, ED    Imaging Review No results found.   EKG Interpretation None      MDM   Final diagnoses:  Muscle cramps    Patient with muscle cramps. Will prescribe Flexeril. Not so that additional narcotics are warranted at this time. Recommend orthopedic followup. Patient understands and agrees with plan. Discussed  preventative measures, such as stretching, exercising, and using rice therapy.   Roxy Horsemanobert Ernan Runkles, PA-C 12/11/13 2021

## 2013-12-11 NOTE — ED Notes (Addendum)
Pt reports neck muscle spasms x 3 days, denies recent injury, F/C. States hx of the same appx 1 year ago, relieved with pain medication. Ambulatory to triage. NAD. Denies taking anything for pain. Pt also requesting pregnancy test.

## 2013-12-15 NOTE — ED Provider Notes (Signed)
Medical screening examination/treatment/procedure(s) were performed by non-physician practitioner and as supervising physician I was immediately available for consultation/collaboration.   EKG Interpretation None        Chamya Hunton W. Shyam Dawson, MD 12/15/13 0339 

## 2013-12-17 ENCOUNTER — Encounter (HOSPITAL_COMMUNITY): Payer: Self-pay | Admitting: Emergency Medicine

## 2013-12-17 ENCOUNTER — Emergency Department (HOSPITAL_COMMUNITY)
Admission: EM | Admit: 2013-12-17 | Discharge: 2013-12-17 | Disposition: A | Discharge status: Home / Self Care | Payer: Medicaid Other | Source: Home / Self Care | Attending: Emergency Medicine | Admitting: Emergency Medicine

## 2013-12-17 DIAGNOSIS — B86 Scabies: Secondary | ICD-10-CM

## 2013-12-17 DIAGNOSIS — X58XXXA Exposure to other specified factors, initial encounter: Secondary | ICD-10-CM

## 2013-12-17 DIAGNOSIS — M62838 Other muscle spasm: Secondary | ICD-10-CM

## 2013-12-17 MED ORDER — DIAZEPAM 5 MG PO TABS: 5.0000 mg | ORAL_TABLET | Freq: Two times a day (BID) | ORAL | Status: DC

## 2013-12-17 MED ORDER — IBUPROFEN 800 MG PO TABS: 800.0000 mg | ORAL_TABLET | Freq: Three times a day (TID) | ORAL | Status: DC

## 2013-12-17 MED ORDER — PERMETHRIN 5 % EX CREA: TOPICAL_CREAM | CUTANEOUS | Status: DC

## 2013-12-17 NOTE — Discharge Instructions (Signed)
Scabies Scabies are small bugs (mites) that burrow under the skin and cause red bumps and severe itching. These bugs can only be seen with a microscope. Scabies are highly contagious. They can spread easily from person to person by direct contact. They are also spread through sharing clothing or linens that have the scabies mites living in them. It is not unusual for an entire family to become infected through shared towels, clothing, or bedding.  HOME CARE INSTRUCTIONS   Your caregiver may prescribe a cream or lotion to kill the mites. If cream is prescribed, massage the cream into the entire body from the neck to the bottom of both feet. Also massage the cream into the scalp and face if your child is less than 968 year old. Avoid the eyes and mouth. Do not wash your hands after application.  Leave the cream on for 8 to 12 hours. Your child should bathe or shower after the 8 to 12 hour application period. Sometimes it is helpful to apply the cream to your child right before bedtime.  One treatment is usually effective and will eliminate approximately 95% of infestations. For severe cases, your caregiver may decide to repeat the treatment in 1 week. Everyone in your household should be treated with one application of the cream.  New rashes or burrows should not appear within 24 to 48 hours after successful treatment. However, the itching and rash may last for 2 to 4 weeks after successful treatment. Your caregiver may prescribe a medicine to help with the itching or to help the rash go away more quickly.  Scabies can live on clothing or linens for up to 3 days. All of your child's recently used clothing, towels, stuffed toys, and bed linens should be washed in hot water and then dried in a dryer for at least 20 minutes on high heat. Items that cannot be washed should be enclosed in a plastic bag for at least 3 days.  To help relieve itching, bathe your child in a cool bath or apply cool washcloths to the  affected areas.  Your child may return to school after treatment with the prescribed cream. SEEK MEDICAL CARE IF:   The itching persists longer than 4 weeks after treatment.  The rash spreads or becomes infected. Signs of infection include red blisters or yellow-tan crust. Document Released: 07/24/2005 Document Revised: 10/16/2011 Document Reviewed: 12/02/2008 [facility] Patient Information 2014 [place], [place]. Torticollis, Acute You have suddenly (acutely) developed a twisted neck (torticollis). This is usually a self-limited condition. CAUSES  Acute torticollis may be caused by malposition, trauma or infection. Most commonly, acute torticollis is caused by sleeping in an awkward position. Torticollis may also be caused by the flexion, extension or twisting of the neck muscles beyond their normal position. Sometimes, the exact cause may not be known. SYMPTOMS  Usually, there is pain and limited movement of the neck. Your neck may twist to one side. DIAGNOSIS  The diagnosis is often made by physical examination. X-rays, CT scans or MRIs may be done if there is a history of trauma or concern of infection. TREATMENT  For a common, stiff neck that develops during sleep, treatment is focused on relaxing the contracted neck muscle. Medications (including shots) may be used to treat the problem. Most cases resolve in several days. Torticollis usually responds to conservative physical therapy. If left untreated, the shortened and spastic neck muscle can cause deformities in the face and neck. Rarely, surgery is required. HOME CARE INSTRUCTIONS  Use over-the-counter and prescription medications as directed by your caregiver.  Do stretching exercises and massage the neck as directed by your caregiver.  Follow up with physical therapy if needed and as directed by your caregiver. SEEK IMMEDIATE MEDICAL CARE IF:   You develop difficulty breathing or noisy breathing (stridor).  You drool, develop  trouble swallowing or have pain with swallowing.  You develop numbness or weakness in the hands or feet.  You have changes in speech or vision.  You have problems with urination or bowel movements.  You have difficulty walking.  You have a fever.  You have increased pain. MAKE SURE YOU:   Understand these instructions.  Will watch your condition.  Will get help right away if you are not doing well or get worse. Document Released: 07/21/2000 Document Revised: 10/16/2011 Document Reviewed: 09/01/2009 [facility] Patient Information 2014 [place], [place].

## 2013-12-17 NOTE — ED Provider Notes (Signed)
CSN: [id number]     Arrival date & time 12/17/13  1025 History   First MD Initiated Contact with Patient 12/17/13 1210     Chief Complaint  Patient presents with  . Neck Pain   (Consider location/radiation/quality/duration/timing/severity/associated sxs/prior Treatment) Patient is a 33 y.o. female presenting with neck pain. The history is provided by the patient. No language interpreter was used.  Neck Pain Pain location:  Generalized neck Quality:  Aching Pain radiates to:  Does not radiate Pain severity:  Moderate Pain is:  Same all the time Onset quality:  Gradual Timing:  Constant Progression:  Worsening Context: not recent injury   Relieved by:  Nothing Worsened by:  Nothing tried Associated symptoms: no weakness     Past Medical History  Diagnosis Date  . No pertinent past medical history   . Abnormal Pap smear    Past Surgical History  Procedure Laterality Date  . No past surgeries     Family History  Problem Relation Age of Onset  . Arthritis Mother   . Hypertension Mother   . Diabetes Mother   . Asthma Mother   . Arthritis Father   . Hypertension Father   . Diabetes Father    History  Substance Use Topics  . Smoking status: Current Some Day Smoker -- 1.00 packs/day for .5 years  . Smokeless tobacco: Never Used  . Alcohol Use: No   OB History   Grav Para Term Preterm Abortions TAB SAB Ect Mult Living   3 2 2  1  1   2      Review of Systems  Musculoskeletal: Positive for neck pain.  Skin: Positive for rash.  Neurological: Negative for weakness.  All other systems reviewed and are negative. Pt also has a rash.  Pt here with 2 children who have scabies  Allergies  Review of patient's allergies indicates no known allergies.  Home Medications   Prior to Admission medications   Medication Sig Start Date End Date Taking? Authorizing Provider  cyclobenzaprine (FLEXERIL) 10 MG tablet Take 1 tablet (10 mg total) by mouth 2 (two) times daily as needed  for muscle spasms. 12/11/13  Yes Roxy Horsemanobert Browning, PA-C  diazepam (VALIUM) 5 MG tablet Take 1 tablet (5 mg total) by mouth 2 (two) times daily. 12/17/13   Elson AreasLeslie K Sofia, PA-C  ibuprofen (ADVIL,MOTRIN) 800 MG tablet Take 1 tablet (800 mg total) by mouth 3 (three) times daily. 12/17/13   Elson AreasLeslie K Sofia, PA-C  permethrin (ELIMITE) 5 % cream Apply to affected area once 12/17/13   Elson AreasLeslie K Sofia, PA-C   BP 110/82  Pulse 74  Temp(Src) 97.7 F (36.5 C) (Oral)  Resp 16  SpO2 100%  LMP 12/05/2013 Physical Exam  Nursing note and vitals reviewed. Constitutional: She is oriented to person, place, and time. She appears well-developed and well-nourished.  HENT:  Head: Normocephalic.  Eyes: Pupils are equal, round, and reactive to light.  Neck:  Decreased range of motion neck,    Cardiovascular: Normal rate.   Pulmonary/Chest: Effort normal.  Abdominal: Soft.  Musculoskeletal: Normal range of motion.  Neurological: She is alert and oriented to person, place, and time. She has normal reflexes.  Skin: Rash noted.  Multiple burrows  Psychiatric: She has a normal mood and affect.    ED Course  Procedures (including critical care time) Labs Review Labs Reviewed - No data to display  Imaging Review No results found.   MDM   1. Scabies   2. Trapezius  muscle spasm    elemite Valium ibuprofen    Elson AreasLeslie K Sofia, [place] JerseyPA-C 12/17/13 1242

## 2013-12-17 NOTE — ED Notes (Signed)
Neck pain, unknown injury.  Onset last week.  Patient was seen and started on flexeril, but patient stopped medicine for concern about a rash.  Patient thinks she is allergic to medicine.  Patient has both children being seen for similar rash.  Mother and 2 children are all patients in same room

## 2013-12-20 NOTE — ED Provider Notes (Signed)
Medical screening examination/treatment/procedure(s) were performed by non-physician practitioner and as supervising physician I was immediately available for consultation/collaboration.  Leslee Homeavid Moises Terpstra, M.D.  Reuben Likesavid C Carey Johndrow, MD 12/20/13 [phone]

## 2014-01-10 ENCOUNTER — Encounter (HOSPITAL_COMMUNITY): Payer: Self-pay | Admitting: Emergency Medicine

## 2014-01-10 ENCOUNTER — Emergency Department (HOSPITAL_COMMUNITY)
Admission: EM | Admit: 2014-01-10 | Discharge: 2014-01-10 | Disposition: A | Discharge status: Home / Self Care | Payer: Medicaid Other | Source: Home / Self Care | Attending: Family Medicine | Admitting: Family Medicine

## 2014-01-10 DIAGNOSIS — M62838 Other muscle spasm: Secondary | ICD-10-CM

## 2014-01-10 MED ORDER — DIAZEPAM 5 MG PO TABS: 5.0000 mg | ORAL_TABLET | Freq: Two times a day (BID) | ORAL | Status: DC | PRN

## 2014-01-10 MED ORDER — TRAMADOL HCL 50 MG PO TABS: 50.0000 mg | ORAL_TABLET | Freq: Four times a day (QID) | ORAL | Status: DC | PRN

## 2014-01-10 NOTE — ED Notes (Signed)
Neck pain for a month, has been seen before for this and treated with pain medicine and muscle relaxer, but pain has worsened over the past 2 weeks

## 2014-01-10 NOTE — Discharge Instructions (Signed)
Take the medications as directed. We are unable to provide further treatment for your neck spasms since the symptoms have persisted. You need to continue your efforts at getting in to see Dr Charlann Boxer for further evaluation.  Muscle Cramps and Spasms Muscle cramps and spasms are when muscles tighten by themselves. They usually get better within minutes. Muscle cramps are painful. They are usually stronger and last longer than muscle spasms. Muscle spasms may or may not be painful. They can last a few seconds or much longer. HOME CARE  Drink enough fluid to keep your pee (urine) clear or pale yellow.  Massage, stretch, and relax the muscle.  Use a warm towel, heating pad, or warm shower water on tight muscles.  Place ice on the muscle if it is tender or in pain.  Put ice in a plastic bag.  Place a towel between your skin and the bag.  Leave the ice on for 15-20 minutes, 03-04 times a day.  Only take medicine as told by your doctor. GET HELP RIGHT AWAY IF:  Your cramps or spasms get worse, happen more often, or do not get better with time. MAKE SURE YOU:  Understand these instructions.  Will watch your condition.  Will get help right away if you are not doing well or get worse. Document Released: 07/06/2008 Document Revised: 11/18/2012 Document Reviewed: 07/10/2012 [facility] Patient Information 2014 [place], [place].

## 2014-01-10 NOTE — ED Provider Notes (Signed)
CSN: [id number]     Arrival date & time 01/10/14  1654 History   First MD Initiated Contact with Patient 01/10/14 1755     Chief Complaint  Patient presents with  . Neck Pain   Patient is a 33 y.o. female presenting with neck pain. The history is provided by the patient.  Neck Pain Pain location:  Generalized neck Quality:  Aching Pain radiates to:  Does not radiate Pain severity:  Moderate Pain is:  Same all the time Onset quality:  Gradual Duration:  1 month Timing:  Constant Progression:  Unchanged Chronicity:  New Context: not fall, not jumping from heights, not lifting a heavy object, not MCA, not MVA, not pedestrian accident and not recent injury   Relieved by:  Muscle relaxants Associated symptoms: no fever, no headaches, no tingling and no weakness   Risk factors: no hx of spinal trauma, no recent head injury and no recurrent falls   Pt reports persistent generalized neck pain. Has rec'd some relief w/ Valium but Flexeril does not help. Pt states she has attempted to arrange f/u w/ Dr Charlann Boxer but they can not get her in for a month. Requesting refills of meds.  Past Medical History  Diagnosis Date  . No pertinent past medical history   . Abnormal Pap smear    Past Surgical History  Procedure Laterality Date  . No past surgeries     Family History  Problem Relation Age of Onset  . Arthritis Mother   . Hypertension Mother   . Diabetes Mother   . Asthma Mother   . Arthritis Father   . Hypertension Father   . Diabetes Father    History  Substance Use Topics  . Smoking status: Current Some Day Smoker -- 1.00 packs/day for .5 years  . Smokeless tobacco: Never Used  . Alcohol Use: No   OB History   Grav Para Term Preterm Abortions TAB SAB Ect Mult Living   3 2 2  1  1   2      Review of Systems  Constitutional: Negative for fever.  Musculoskeletal: Positive for neck pain.  Neurological: Negative for tingling, weakness and headaches.  All other systems reviewed  and are negative.   Allergies  Review of patient's allergies indicates no known allergies.  Home Medications   Prior to Admission medications   Medication Sig Start Date End Date Taking? Authorizing Provider  cyclobenzaprine (FLEXERIL) 10 MG tablet Take 1 tablet (10 mg total) by mouth 2 (two) times daily as needed for muscle spasms. 12/11/13  Yes Roxy Horseman, PA-C  ibuprofen (ADVIL,MOTRIN) 800 MG tablet Take 1 tablet (800 mg total) by mouth 3 (three) times daily. 12/17/13  Yes Lonia Skinner Sofia, PA-C  diazepam (VALIUM) 5 MG tablet Take 1 tablet (5 mg total) by mouth 2 (two) times daily. 12/17/13   Elson Areas, PA-C  diazepam (VALIUM) 5 MG tablet Take 1 tablet (5 mg total) by mouth every 12 (twelve) hours as needed for muscle spasms. 01/10/14   Roma Kayser Lyndon Chenoweth, NP  permethrin (ELIMITE) 5 % cream Apply to affected area once 12/17/13   Elson Areas, PA-C  traMADol (ULTRAM) 50 MG tablet Take 1 tablet (50 mg total) by mouth every 6 (six) hours as needed. 01/10/14   Roma Kayser Donnald Tabar, NP   BP 109/77  Pulse 76  Temp(Src) 97.4 F (36.3 C) (Oral)  Resp 18  SpO2 98%  LMP 12/05/2013 Physical Exam  Constitutional: She is oriented to person,  place, and time. She appears well-developed and well-nourished.  HENT:  Head: Normocephalic and atraumatic.  Eyes: Conjunctivae are normal.  Neck: Normal range of motion. Neck supple.  Cardiovascular: Normal rate.   Pulmonary/Chest: Effort normal.  Musculoskeletal: Normal range of motion.  Neurological: She is alert and oriented to person, place, and time.  Skin: Skin is warm and dry.  Psychiatric: She has a normal mood and affect.    ED Course  Procedures (including critical care time) Labs Review Labs Reviewed - No data to display  Imaging Review No results found.   MDM   1. Muscle spasms of neck    Refilled Valium Tramadol added Continue efforts at arranging f/u w/ Ortho. Pt informed we would not be able to continue to refill  scheduled meds for same problem. Pt voices understanding.    Leanne ChangKatherine P Prateek Knipple, NP 01/12/14 2137

## 2014-01-13 NOTE — ED Provider Notes (Signed)
Medical screening examination/treatment/procedure(s) were performed by a resident physician or non-physician practitioner and as the supervising physician I was immediately available for consultation/collaboration.  Leeon Makar, MD    Syble Picco S Sydny Schnitzler, MD 01/13/14 0745 

## 2014-06-08 ENCOUNTER — Encounter (HOSPITAL_COMMUNITY): Payer: Self-pay | Admitting: Emergency Medicine

## 2014-09-29 ENCOUNTER — Emergency Department (HOSPITAL_COMMUNITY)
Admission: EM | Admit: 2014-09-29 | Discharge: 2014-09-29 | Disposition: A | Discharge status: Home / Self Care | Payer: Medicaid Other | Attending: Emergency Medicine | Admitting: Emergency Medicine

## 2014-09-29 ENCOUNTER — Encounter (HOSPITAL_COMMUNITY): Payer: Self-pay

## 2014-09-29 DIAGNOSIS — K088 Other specified disorders of teeth and supporting structures: Secondary | ICD-10-CM | POA: Diagnosis present

## 2014-09-29 DIAGNOSIS — Z720 Tobacco use: Secondary | ICD-10-CM | POA: Insufficient documentation

## 2014-09-29 DIAGNOSIS — R1310 Dysphagia, unspecified: Secondary | ICD-10-CM | POA: Diagnosis not present

## 2014-09-29 DIAGNOSIS — K0889 Other specified disorders of teeth and supporting structures: Secondary | ICD-10-CM

## 2014-09-29 MED ORDER — HYDROCODONE-ACETAMINOPHEN 5-325 MG PO TABS: 1.0000 | ORAL_TABLET | ORAL | Status: DC | PRN

## 2014-09-29 MED ORDER — PENICILLIN V POTASSIUM 500 MG PO TABS
500.0000 mg | ORAL_TABLET | Freq: Once | ORAL | Status: AC
  Administered 2014-09-29: 500 mg via ORAL
  Filled 2014-09-29: qty 1

## 2014-09-29 MED ORDER — PENICILLIN V POTASSIUM 500 MG PO TABS: 500.0000 mg | ORAL_TABLET | Freq: Three times a day (TID) | ORAL | Status: DC

## 2014-09-29 MED ORDER — OXYCODONE-ACETAMINOPHEN 5-325 MG PO TABS
1.0000 | ORAL_TABLET | Freq: Once | ORAL | Status: AC
  Administered 2014-09-29: 1 via ORAL
  Filled 2014-09-29: qty 1

## 2014-09-29 MED ORDER — IBUPROFEN 800 MG PO TABS: 800.0000 mg | ORAL_TABLET | Freq: Three times a day (TID) | ORAL | Status: DC

## 2014-09-29 NOTE — ED Provider Notes (Signed)
CSN: [id number]     Arrival date & time 09/29/14  0228 History   First MD Initiated Contact with Patient 09/29/14 [phone]     Chief Complaint  Patient presents with  . Dental Pain     (Consider location/radiation/quality/duration/timing/severity/associated sxs/prior Treatment) Patient is a 34 y.o. female presenting with tooth pain. The history is provided by the patient. No language interpreter was used.  Dental Pain Location:  Upper Associated symptoms: no fever and no neck pain   Associated symptoms comment:  Dental pain and facial swelling for the past 2 days. She reports swelling that extends into the lateral neck. She reports also some difficulty swallowing. No known fever.    Past Medical History  Diagnosis Date  . No pertinent past medical history   . Abnormal Pap smear    Past Surgical History  Procedure Laterality Date  . No past surgeries     Family History  Problem Relation Age of Onset  . Arthritis Mother   . Hypertension Mother   . Diabetes Mother   . Asthma Mother   . Arthritis Father   . Hypertension Father   . Diabetes Father    History  Substance Use Topics  . Smoking status: Current Some Day Smoker -- 1.00 packs/day for .5 years  . Smokeless tobacco: Never Used  . Alcohol Use: No   OB History    Gravida Para Term Preterm AB TAB SAB Ectopic Multiple Living   Review of Systems  Constitutional: Negative for fever.  HENT: Positive for dental problem and trouble swallowing. Negative for sore throat.   Respiratory: Negative for shortness of breath.   Gastrointestinal: Negative for nausea.  Musculoskeletal: Negative for neck pain.      Allergies  Review of patient's allergies indicates no known allergies.  Home Medications   Prior to Admission medications   Medication Sig Start Date End Date Taking? Authorizing Provider  traMADol (ULTRAM) 50 MG tablet Take 1 tablet (50 mg total) by mouth every 6 (six) hours as needed. 01/10/14   Yes Roma Kayser Schorr, NP  cyclobenzaprine (FLEXERIL) 10 MG tablet Take 1 tablet (10 mg total) by mouth 2 (two) times daily as needed for muscle spasms. Patient not taking: Reported on 09/29/2014 12/11/13   Roxy Horseman, PA-C  diazepam (VALIUM) 5 MG tablet Take 1 tablet (5 mg total) by mouth 2 (two) times daily. Patient not taking: Reported on 09/29/2014 12/17/13   Elson Areas, PA-C  diazepam (VALIUM) 5 MG tablet Take 1 tablet (5 mg total) by mouth every 12 (twelve) hours as needed for muscle spasms. Patient not taking: Reported on 09/29/2014 01/10/14   Roma Kayser Schorr, NP  ibuprofen (ADVIL,MOTRIN) 800 MG tablet Take 1 tablet (800 mg total) by mouth 3 (three) times daily. Patient not taking: Reported on 09/29/2014 12/17/13   Elson Areas, PA-C  permethrin (ELIMITE) 5 % cream Apply to affected area once Patient not taking: Reported on 09/29/2014 12/17/13   Elson Areas, PA-C   BP 141/68 mmHg  Pulse 91  Temp(Src) 99.5 F (37.5 C) (Rectal)  Resp 24  SpO2 100% Physical Exam  Constitutional: She is oriented to person, place, and time. She appears well-developed and well-nourished. No distress.  Sleeping initially on exam.  HENT:  Left facial swelling noted. Generally good dentition with severe decay to #17 with surrounding swelling.  Eyes: Conjunctivae are normal.  Neurological: She is alert and  oriented to person, place, and time.  Skin: Skin is warm.    ED Course  Procedures (including critical care time) Labs Review Labs Reviewed - No data to display  Imaging Review No results found.   EKG Interpretation None      MDM   Final diagnoses:  None    1. Dental caries  The patient is handling secretions without difficulty. Dental decay to rear left molar. Will treat with abx and penicillin. Encourage dental follow up.    Arnoldo HookerShari A Esther Broyles, PA-C 09/29/14 0449  Olivia Mackielga M Otter, MD 09/29/14 [phone]

## 2014-09-29 NOTE — Discharge Instructions (Signed)
Dental Pain °A tooth ache may be caused by cavities (tooth decay). Cavities expose the nerve of the tooth to air and hot or cold temperatures. It may come from an infection or abscess (also called a boil or furuncle) around your tooth. It is also often caused by dental caries (tooth decay). This causes the pain you are having. °DIAGNOSIS  °Your caregiver can diagnose this problem by exam. °TREATMENT  °· If caused by an infection, it may be treated with medications which kill germs (antibiotics) and pain medications as prescribed by your caregiver. Take medications as directed. °· Only take over-the-counter or prescription medicines for pain, discomfort, or fever as directed by your caregiver. °· Whether the tooth ache today is caused by infection or dental disease, you should see your dentist as soon as possible for further care. °SEEK MEDICAL CARE IF: °The exam and treatment you received today has been provided on an emergency basis only. This is not a substitute for complete medical or dental care. If your problem worsens or new problems (symptoms) appear, and you are unable to meet with your dentist, call or return to this location. °SEEK IMMEDIATE MEDICAL CARE IF:  °· You have a fever. °· You develop redness and swelling of your face, jaw, or neck. °· You are unable to open your mouth. °· You have severe pain uncontrolled by pain medicine. °MAKE SURE YOU:  °· Understand these instructions. °· Will watch your condition. °· Will get help right away if you are not doing well or get worse. °Document Released: 07/24/2005 Document Revised: 10/16/2011 Document Reviewed: 03/11/2008 °ExitCare® Patient Information ©2015 [employer]. This information is not intended to replace advice given to you by your health care provider. Make sure you discuss any questions you have with your health care provider. ° °Dental Care and Dentist Visits °Dental care supports good overall health. Regular dental visits can also help you  avoid dental pain, bleeding, infection, and other more serious health problems in the future. It is important to keep the mouth healthy because diseases in the teeth, gums, and other oral tissues can spread to other areas of the body. Some problems, such as diabetes, heart disease, and pre-term labor have been associated with poor oral health.  °See your dentist every 6 months. If you experience emergency problems such as a toothache or broken tooth, go to the dentist right away. If you see your dentist regularly, you may catch problems early. It is easier to be treated for problems in the early stages.  °WHAT TO EXPECT AT A DENTIST VISIT  °Your dentist will look for many common oral health problems and recommend proper treatment. At your regular dental visit, you can expect: °· Gentle cleaning of the teeth and gums. This includes scraping and polishing. This helps to remove the sticky substance around the teeth and gums (plaque). Plaque forms in the mouth shortly after eating. Over time, plaque hardens on the teeth as tartar. If tartar is not removed regularly, it can cause problems. Cleaning also helps remove stains. °· Periodic X-rays. These pictures of the teeth and supporting bone will help your dentist assess the health of your teeth. °· Periodic fluoride treatments. Fluoride is a natural mineral shown to help strengthen teeth. Fluoride treatment involves applying a fluoride gel or varnish to the teeth. It is most commonly done in children. °· Examination of the mouth, tongue, jaws, teeth, and gums to look for any oral health problems, such as: °¨ Cavities (dental caries). This is   decay on the tooth caused by plaque, sugar, and acid in the mouth. It is best to catch a cavity when it is small. °¨ Inflammation of the gums caused by plaque buildup (gingivitis). °¨ Problems with the mouth or malformed or misaligned teeth. °¨ Oral cancer or other diseases of the soft tissues or jaws.  °KEEP YOUR TEETH AND GUMS  HEALTHY °For healthy teeth and gums, follow these general guidelines as well as your dentist's specific advice: °· Have your teeth professionally cleaned at the dentist every 6 months. °· Brush twice daily with a fluoride toothpaste. °· Floss your teeth daily.  °· Ask your dentist if you need fluoride supplements, treatments, or fluoride toothpaste. °· Eat a healthy diet. Reduce foods and drinks with added sugar. °· Avoid smoking. °TREATMENT FOR ORAL HEALTH PROBLEMS °If you have oral health problems, treatment varies depending on the conditions present in your teeth and gums. °· Your caregiver will most likely recommend good oral hygiene at each visit. °· For cavities, gingivitis, or other oral health disease, your caregiver will perform a procedure to treat the problem. This is typically done at a separate appointment. Sometimes your caregiver will refer you to another dental specialist for specific tooth problems or for surgery. °SEEK IMMEDIATE DENTAL CARE IF: °· You have pain, bleeding, or soreness in the gum, tooth, jaw, or mouth area. °· A permanent tooth becomes loose or separated from the gum socket. °· You experience a blow or injury to the mouth or jaw area. °Document Released: 04/05/2011 Document Revised: 10/16/2011 Document Reviewed: 04/05/2011 °ExitCare® Patient Information ©2015 [employer]. This information is not intended to replace advice given to you by your health care provider. Make sure you discuss any questions you have with your health care provider. ° ° °Emergency Department Resource Guide °1) Find a Doctor and Pay Out of Pocket °Although you won't have to find out who is covered by your insurance plan, it is a good idea to ask around and get recommendations. You will then need to call the office and see if the doctor you have chosen will accept you as a new patient and what types of options they offer for patients who are self-pay. Some doctors offer discounts or will set up payment plans  for their patients who do not have insurance, but you will need to ask so you aren't surprised when you get to your appointment. ° °2) Contact Your Local Health Department °Not all health departments have doctors that can see patients for sick visits, but many do, so it is worth a call to see if yours does. If you don't know where your local health department is, you can check in your phone book. The CDC also has a tool to help you locate your state's health department, and many state websites also have listings of all of their local health departments. ° °3) Find a Walk-in Clinic °If your illness is not likely to be very severe or complicated, you may want to try a walk in clinic. These are popping up all over the country in pharmacies, drugstores, and shopping centers. They're usually staffed by nurse practitioners or physician assistants that have been trained to treat common illnesses and complaints. They're usually fairly quick and inexpensive. However, if you have serious medical issues or chronic medical problems, these are probably not your best option. ° °No Primary Care Doctor: °- Call [facility] at  832-8000 - they can help you locate a primary care doctor that  accepts your   insurance, provides certain services, etc. °- Physician Referral Service- 1-800-533-3463 ° °Chronic Pain Problems: °Organization         Address  Phone   Notes  ° [facility]  ([phone]) [phone] Patients need to be referred by their primary care doctor.  ° °Medication Assistance: °Organization         Address  Phone   Notes  °[place] [facility] [address] °[place], [place] [address] (336) 641-8030 --Must be a resident of [place] °-- Must have NO insurance coverage whatsoever (no Medicaid/ Medicare, etc.) °-- The pt. MUST have a primary care doctor that directs their care regularly and follows them in the community °  °[facility]  (866) 331-1348   °[employer]  (888)  892-1162   ° °Agencies that provide inexpensive medical care: °Organization         Address  Phone   Notes  °[facility]  ([phone]) [phone]   °[facility]    (336) 832-7272   °[facility] [address] °[place], [place] [address] (336) 832-4777   °[facility] [address], °[place] (336) [phone]   °[facility]    (336) 373-0678   °[facility]    (336) 272-1050   °[facility] ° [address], [place] Phone:  (336) 832-4444, Fax:  (336) [phone] Hours of Operation:  9 am - 6 pm, M-F.  Also accepts Medicaid/Medicare and self-pay.  °[facility] ° [address], [place] Phone: ([phone]) [phone], Fax: ([phone]) [phone]. Hours of Operation:  8:30 am - 5:30 pm, M-F.  Also accepts Medicaid and self-pay.  °[facility] [place] [address], [place] Phone: ([phone]) [phone]   °[facility] [address], [place], [place] (336)723-1848, Ext. 123 Mondays & Thursdays: 7-9 AM.  First 15 patients are seen on a first come, first serve basis. °  ° °Medicaid-accepting [place] Providers: ° °Organization         Address  Phone   Notes  °[facility] [address], [place] ([phone]) [phone] Also accepts self-pay patients.  °[facility] [address], [place] ° (336) [phone]   °[facility] [address], [place] (336) [phone]   °[facility] [address] [place] [address], [place] (336) [phone]   °[facility] [address], [place]  ° ([phone]) [phone] Only accepts [place] Access Medicaid patients after they have their name applied to their card.  ° °Self-Pay (no insurance) in [place]: ° °Organization         Address  Phone   Notes  °Sickle Cell Patients, [facility] [address], [place] (336)  [phone]   °[facility] [address], [place] (336) [phone]   °[facility] ° [address], [place] (336) [phone]   °[facility]/Dr. Osei-Bonsu ° [address] [place] [address], [place] or [address], [place] ([phone]) [phone] Phone number for both [place] and [place] locations is the same.  °[facility] [address], [place] ([phone]) [phone]   °[facility] [address] [place] [address], [place] or [address] (336) [phone] °(336) 878-2260   °[facility] [address], [place] (336) 350-1642, phone; (336) [phone], fax Sees patients 1st and 3rd Saturday of every month.  Must not qualify for public or private insurance (i.e. Medicaid, Medicare, [place] Health Choice, Veterans' Benefits) • Household income should be no more than 200% of the poverty level •The clinic cannot treat you if you are pregnant or think you are pregnant • Sexually transmitted diseases are not treated at the clinic.  ° ° °Dental Care: °Organization         Address  Phone  Notes  °[facility] [address], [place] (336) [phone] Accepts children up to age 21 who are enrolled in Medicaid or [place] Health Choice; pregnant women with a Medicaid card; and children who have applied for Medicaid or [place] Health Choice, but were declined, whose parents can pay a reduced fee at time of service.  °[facility] [place]  [address], [place] (336) 641-7733 Accepts children up to age 21 who are enrolled in Medicaid or [place] Health Choice; pregnant women with a Medicaid card; and children who have applied for Medicaid or [place] Health Choice, but were declined, whose parents can pay a reduced fee at time of service.  °[facility] ° [address], [place] ([phone]) [phone] Patients are  seen by appointment only. Walk-ins are not accepted. [facility] will see patients 18 years of age and older. °Monday - Tuesday (8am-5pm) °Most Wednesdays (8:30-5pm) °$30 per visit, cash only  °[facility] ° [address], [place] ([phone]) [phone] Patients are seen by appointment only. Walk-ins are not accepted. [facility] will see patients 18 years of age and older. °One Wednesday Evening (Monthly: Volunteer Based).  $30 per visit, cash only  °[facility]  ([phone]) [phone] for adults; Children under age 4, call Graduate Pediatric Dentistry at ([phone]) [phone]. Children aged 4-14, please call ([phone]) [phone] to request a pediatric application. ° Dental services are provided in all areas of dental care including fillings, crowns and bridges, complete and partial dentures, implants, gum treatment, root canals, and extractions. Preventive care is also provided. Treatment is provided to both adults and children. °Patients are selected via a lottery and there is often a waiting list. °  °[facility] [address], ° ° (336) 763-8833 [email or web address] °  °[facility] [address], [place], [place] (336)723-1848, Ext. 123 Second and Fourth Thursday of each month, opens at 6:30 AM; Clinic ends at 9 AM.  Patients are seen on a first-come first-served basis, and a limited number are seen during each clinic.  ° °[facility] ° [address], [place], [place] ([phone]) [phone]   Eligibility Requirements °You must have lived in [place], [place], or [place] counties for at least the last three months. °  You cannot be eligible for state or federal sponsored healthcare insurance, including Veterans Administration, Medicaid, or Medicare. °  You generally cannot be eligible for healthcare insurance through your employer.  °  How to apply: °Eligibility screenings are held every Tuesday and Wednesday afternoon from 1:00 pm until 4:00  pm. You do not need an appointment for the interview!  °[facility] [address], [place], [place] [phone]   °[facility]  [phone]   °[facility]  [phone]   °[facility]  [phone]   ° °

## 2014-09-29 NOTE — ED Notes (Signed)
Pt refusing oral temp, agrees to rectal temp when placed in room .

## 2014-09-29 NOTE — ED Notes (Signed)
Pt complains of dental nerve pain

## 2014-10-09 ENCOUNTER — Encounter (HOSPITAL_COMMUNITY): Payer: Self-pay | Admitting: Emergency Medicine

## 2014-10-09 ENCOUNTER — Emergency Department (HOSPITAL_COMMUNITY)
Admission: EM | Admit: 2014-10-09 | Discharge: 2014-10-09 | Disposition: A | Discharge status: Home / Self Care | Payer: Medicaid Other | Attending: Emergency Medicine | Admitting: Emergency Medicine

## 2014-10-09 DIAGNOSIS — K0381 Cracked tooth: Secondary | ICD-10-CM | POA: Insufficient documentation

## 2014-10-09 DIAGNOSIS — Z720 Tobacco use: Secondary | ICD-10-CM | POA: Diagnosis not present

## 2014-10-09 DIAGNOSIS — Z79899 Other long term (current) drug therapy: Secondary | ICD-10-CM | POA: Diagnosis not present

## 2014-10-09 DIAGNOSIS — K088 Other specified disorders of teeth and supporting structures: Secondary | ICD-10-CM | POA: Insufficient documentation

## 2014-10-09 DIAGNOSIS — R509 Fever, unspecified: Secondary | ICD-10-CM | POA: Diagnosis not present

## 2014-10-09 DIAGNOSIS — K0889 Other specified disorders of teeth and supporting structures: Secondary | ICD-10-CM

## 2014-10-09 MED ORDER — HYDROCODONE-ACETAMINOPHEN 5-325 MG PO TABS: 1.0000 | ORAL_TABLET | ORAL | Status: DC | PRN

## 2014-10-09 MED ORDER — PENICILLIN V POTASSIUM 500 MG PO TABS
500.0000 mg | ORAL_TABLET | Freq: Four times a day (QID) | ORAL | Status: DC
Start: 2014-10-09 — End: 2015-01-16

## 2014-10-09 MED ORDER — NAPROXEN 500 MG PO TABS
500.0000 mg | ORAL_TABLET | Freq: Two times a day (BID) | ORAL | Status: DC
Start: 2014-10-09 — End: 2014-10-09

## 2014-10-09 NOTE — Discharge Instructions (Signed)
Dental Pain °A tooth ache may be caused by cavities (tooth decay). Cavities expose the nerve of the tooth to air and hot or cold temperatures. It may come from an infection or abscess (also called a boil or furuncle) around your tooth. It is also often caused by dental caries (tooth decay). This causes the pain you are having. °DIAGNOSIS  °Your caregiver can diagnose this problem by exam. °TREATMENT  °· If caused by an infection, it may be treated with medications which kill germs (antibiotics) and pain medications as prescribed by your caregiver. Take medications as directed. °· Only take over-the-counter or prescription medicines for pain, discomfort, or fever as directed by your caregiver. °· Whether the tooth ache today is caused by infection or dental disease, you should see your dentist as soon as possible for further care. °SEEK MEDICAL CARE IF: °The exam and treatment you received today has been provided on an emergency basis only. This is not a substitute for complete medical or dental care. If your problem worsens or new problems (symptoms) appear, and you are unable to meet with your dentist, call or return to this location. °SEEK IMMEDIATE MEDICAL CARE IF:  °· You have a fever. °· You develop redness and swelling of your face, jaw, or neck. °· You are unable to open your mouth. °· You have severe pain uncontrolled by pain medicine. °MAKE SURE YOU:  °· Understand these instructions. °· Will watch your condition. °· Will get help right away if you are not doing well or get worse. °Document Released: 07/24/2005 Document Revised: 10/16/2011 Document Reviewed: 03/11/2008 °ExitCare® Patient Information ©2015 [employer]. This information is not intended to replace advice given to you by your health care provider. Make sure you discuss any questions you have with your health care provider. ° °Dental Care and Dentist Visits °Dental care supports good overall health. Regular dental visits can also help you  avoid dental pain, bleeding, infection, and other more serious health problems in the future. It is important to keep the mouth healthy because diseases in the teeth, gums, and other oral tissues can spread to other areas of the body. Some problems, such as diabetes, heart disease, and pre-term labor have been associated with poor oral health.  °See your dentist every 6 months. If you experience emergency problems such as a toothache or broken tooth, go to the dentist right away. If you see your dentist regularly, you may catch problems early. It is easier to be treated for problems in the early stages.  °WHAT TO EXPECT AT A DENTIST VISIT  °Your dentist will look for many common oral health problems and recommend proper treatment. At your regular dental visit, you can expect: °· Gentle cleaning of the teeth and gums. This includes scraping and polishing. This helps to remove the sticky substance around the teeth and gums (plaque). Plaque forms in the mouth shortly after eating. Over time, plaque hardens on the teeth as tartar. If tartar is not removed regularly, it can cause problems. Cleaning also helps remove stains. °· Periodic X-rays. These pictures of the teeth and supporting bone will help your dentist assess the health of your teeth. °· Periodic fluoride treatments. Fluoride is a natural mineral shown to help strengthen teeth. Fluoride treatment involves applying a fluoride gel or varnish to the teeth. It is most commonly done in children. °· Examination of the mouth, tongue, jaws, teeth, and gums to look for any oral health problems, such as: °¨ Cavities (dental caries). This is   decay on the tooth caused by plaque, sugar, and acid in the mouth. It is best to catch a cavity when it is small. °¨ Inflammation of the gums caused by plaque buildup (gingivitis). °¨ Problems with the mouth or malformed or misaligned teeth. °¨ Oral cancer or other diseases of the soft tissues or jaws.  °KEEP YOUR TEETH AND GUMS  HEALTHY °For healthy teeth and gums, follow these general guidelines as well as your dentist's specific advice: °· Have your teeth professionally cleaned at the dentist every 6 months. °· Brush twice daily with a fluoride toothpaste. °· Floss your teeth daily.  °· Ask your dentist if you need fluoride supplements, treatments, or fluoride toothpaste. °· Eat a healthy diet. Reduce foods and drinks with added sugar. °· Avoid smoking. °TREATMENT FOR ORAL HEALTH PROBLEMS °If you have oral health problems, treatment varies depending on the conditions present in your teeth and gums. °· Your caregiver will most likely recommend good oral hygiene at each visit. °· For cavities, gingivitis, or other oral health disease, your caregiver will perform a procedure to treat the problem. This is typically done at a separate appointment. Sometimes your caregiver will refer you to another dental specialist for specific tooth problems or for surgery. °SEEK IMMEDIATE DENTAL CARE IF: °· You have pain, bleeding, or soreness in the gum, tooth, jaw, or mouth area. °· A permanent tooth becomes loose or separated from the gum socket. °· You experience a blow or injury to the mouth or jaw area. °Document Released: 04/05/2011 Document Revised: 10/16/2011 Document Reviewed: 04/05/2011 °ExitCare® Patient Information ©2015 [employer]. This information is not intended to replace advice given to you by your health care provider. Make sure you discuss any questions you have with your health care provider. ° °

## 2014-10-09 NOTE — ED Notes (Signed)
Pt reports being seen here 2 weeks ago for dental pain, was prescribed a course of antibiotics and pain went away. Today while eating cornbread, pt's tooth began hurting again. Pain is to L lower molar.

## 2014-10-09 NOTE — ED Provider Notes (Signed)
CSN: [id number]     Arrival date & time 10/09/14  1527 History  This chart was scribed for non-physician practitioner, Everlene Farrier, PA-C working with Toy Cookey, MD, by Jarvis Morgan, ED Scribe. This patient was seen in room WTR7/WTR7 and the patient's care was started at 4:19 PM.      Chief Complaint  Patient presents with  . Dental Pain    The history is provided by the patient. No language interpreter was used.    HPI Comments: Erica [name] is a 34 y.o. female who presents to the Emergency Department complaining of constant, "10/10", left lower dental pain for 2 weeks. Pt was seen in the ED two weeks ago for the same and was given antibiotics and states the pain went away. Pt states today while eating the pain returned. Pt is complaining of associated subjective fever or chills. She has taking a Tramadol today with no relief. She reports she went a saw the dentist she was referred to 2 weeks ago and they referred her to an oral surgeon, Dr. Barbette Merino. She has an appt with Dr. Barbette Merino for March 22nd. The pain is exacerbated by cold air and cold fluids. She denies any drainage from gums or mouth. She denies any trouble swallowing, sore throat, neck pain, neck stiffness, HA, vision changes, eye pain ,ear pain,  ear drainage, difficulty opening jaw, cough ,wheezing, SOB, nausea, vomiting or abdominal pain.   Past Medical History  Diagnosis Date  . No pertinent past medical history   . Abnormal Pap smear    Past Surgical History  Procedure Laterality Date  . No past surgeries     Family History  Problem Relation Age of Onset  . Arthritis Mother   . Hypertension Mother   . Diabetes Mother   . Asthma Mother   . Arthritis Father   . Hypertension Father   . Diabetes Father    History  Substance Use Topics  . Smoking status: Current Some Day Smoker -- 1.00 packs/day for .5 years  . Smokeless tobacco: Never Used  . Alcohol Use: No   OB History    Gravida Para Term Preterm AB TAB  SAB Ectopic Multiple Living   Review of Systems  Constitutional: Positive for fever (subjective) and chills (subjective).  HENT: Positive for dental problem (left lower molar). Negative for ear discharge, ear pain, facial swelling, mouth sores, sore throat and trouble swallowing.   Eyes: Negative for pain and visual disturbance.  Respiratory: Negative for cough, shortness of breath and wheezing.   Gastrointestinal: Negative for nausea, vomiting and abdominal pain.  Musculoskeletal: Negative for neck pain and neck stiffness.  Neurological: Negative for headaches.      Allergies  Review of patient's allergies indicates no known allergies.  Home Medications   Prior to Admission medications   Medication Sig Start Date End Date Taking? Authorizing Provider  ibuprofen (ADVIL,MOTRIN) 800 MG tablet Take 1 tablet (800 mg total) by mouth 3 (three) times daily. 09/29/14  Yes Shari A Upstill, PA-C  Pseudoephedrine-DM-GG (ROBITUSSIN CF PO) Take 2.5 mLs by mouth once.   Yes Historical Provider, MD  traMADol (ULTRAM) 50 MG tablet Take 1 tablet (50 mg total) by mouth every 6 (six) hours as needed. 01/10/14  Yes Roma Kayser Schorr, NP  cyclobenzaprine (FLEXERIL) 10 MG tablet Take 1 tablet (10 mg total) by mouth 2 (two) times daily as needed for muscle spasms. Patient not  taking: Reported on 09/29/2014 12/11/13   Roxy Horseman, PA-C  diazepam (VALIUM) 5 MG tablet Take 1 tablet (5 mg total) by mouth 2 (two) times daily. Patient not taking: Reported on 09/29/2014 12/17/13   Elson Areas, PA-C  diazepam (VALIUM) 5 MG tablet Take 1 tablet (5 mg total) by mouth every 12 (twelve) hours as needed for muscle spasms. Patient not taking: Reported on 09/29/2014 01/10/14   Roma Kayser Schorr, NP  HYDROcodone-acetaminophen (NORCO/VICODIN) 5-325 MG per tablet Take 1 tablet by mouth every 4 (four) hours as needed. 10/09/14   Einar Gip Phelan Goers, PA-C  penicillin v potassium (VEETID) 500 MG tablet Take 1  tablet (500 mg total) by mouth 4 (four) times daily. 10/09/14   Einar Gip Austin Pongratz, PA-C  permethrin (ELIMITE) 5 % cream Apply to affected area once Patient not taking: Reported on 09/29/2014 12/17/13   Elson Areas, PA-C   Triage Vitals: BP 113/79 mmHg  Pulse 79  Temp(Src) 98.8 F (37.1 C) (Oral)  Resp 18  Ht  (1.651 m)  Wt 193 lb (87.544 kg)  BMI 32.12 kg/m2  SpO2 97%  LMP 09/20/2014  Physical Exam  Constitutional: She is oriented to person, place, and time. She appears well-developed and well-nourished. No distress.  Nontoxic appearing.  HENT:  Head: Normocephalic and atraumatic.  Right Ear: Tympanic membrane and external ear normal.  Left Ear: Tympanic membrane and external ear normal.  Nose: Nose normal.  Mouth/Throat: Uvula is midline, oropharynx is clear and moist and mucous membranes are normal. No dental abscesses or uvula swelling. No oropharyngeal exudate, posterior oropharyngeal edema or posterior oropharyngeal erythema.  No tonsillar hypertrophy. Uvula is midline without edema. No Ludwig's angina. Soft palate rises symmetrically. Cracked left lower molar, no abscess, induration or area of fluctuance.   Eyes: Conjunctivae and EOM are normal. Pupils are equal, round, and reactive to light. Right eye exhibits no discharge. Left eye exhibits no discharge.  Neck: Normal range of motion. Neck supple. No JVD present.  Cardiovascular: Normal rate, regular rhythm, normal heart sounds and intact distal pulses.   Pulmonary/Chest: Effort normal. No respiratory distress. She has no rales.  Abdominal: Soft. There is no tenderness.  Musculoskeletal: Normal range of motion.  Lymphadenopathy:    She has no cervical adenopathy.  Neurological: She is alert and oriented to person, place, and time. No cranial nerve deficit. Coordination normal.  Cranial nerves are intact.  Skin: Skin is warm and dry. No rash noted. She is not diaphoretic. No erythema. No pallor.  Psychiatric: She  has a normal mood and affect. Her behavior is normal.  Nursing note and vitals reviewed.   ED Course  Procedures (including critical care time)  DIAGNOSTIC STUDIES: Oxygen Saturation is 97% on RA, normal by my interpretation.    COORDINATION OF CARE: 4:18 PM- Will discharge pt with penicillin and Vicodin.  Pt advised of plan for treatment and pt agrees.   Labs Review Labs Reviewed - No data to display  Imaging Review No results found.   EKG Interpretation None      Filed Vitals:   10/09/14 1534  BP: 113/79  Pulse: 79  Temp: 98.8 F (37.1 C)  TempSrc: Oral  Resp: 18  Height:  (1.651 m)  Weight: 193 lb (87.544 kg)  SpO2: 97%     MDM   Meds given in ED:  Medications - No data to display  New Prescriptions   HYDROCODONE-ACETAMINOPHEN (NORCO/VICODIN) 5-325 MG PER TABLET    Take 1  tablet by mouth every 4 (four) hours as needed.   PENICILLIN V POTASSIUM (VEETID) 500 MG TABLET    Take 1 tablet (500 mg total) by mouth 4 (four) times daily.    Final diagnoses:  Pain, dental   This is a 34 y.o. female who presents to the Emergency Department complaining of constant, "10/10", left lower dental pain for 2 weeks. Pt was seen in the ED two weeks ago for the same and was given antibiotics and states the pain went away. Pt states today while eating the pain returned. Patient is scheduled for tooth extraction by Dr. Barbette MerinoJensen in a few weeks. She reports her pain returned worse today. The patient is afebrile and nontoxic appearing.  No gross abscess.  Exam unconcerning for Ludwig's angina or spread of infection.  Will treat with penicillin and pain medicine.  Urged patient to follow-up with dentist and make appointment for follow-up within 48 hours. I advised the patient to follow-up with their primary care provider this week. I advised the patient to return to the emergency department with new or worsening symptoms or new concerns. The patient verbalized understanding and  agreement with plan.    I personally performed the services described in this documentation, which was scribed in my presence. The recorded information has been reviewed and is accurate.       Lawana ChambersWilliam Duncan Jshawn Hurta, PA-C 10/09/14 1622  Toy CookeyMegan Docherty, MD 10/10/14 [phone]

## 2014-10-15 ENCOUNTER — Emergency Department (HOSPITAL_COMMUNITY)
Admission: EM | Admit: 2014-10-15 | Discharge: 2014-10-15 | Disposition: A | Discharge status: Home / Self Care | Payer: Medicaid Other | Attending: Emergency Medicine | Admitting: Emergency Medicine

## 2014-10-15 ENCOUNTER — Encounter (HOSPITAL_COMMUNITY): Payer: Self-pay | Admitting: *Deleted

## 2014-10-15 DIAGNOSIS — Z792 Long term (current) use of antibiotics: Secondary | ICD-10-CM | POA: Diagnosis not present

## 2014-10-15 DIAGNOSIS — K029 Dental caries, unspecified: Secondary | ICD-10-CM | POA: Insufficient documentation

## 2014-10-15 DIAGNOSIS — Z720 Tobacco use: Secondary | ICD-10-CM | POA: Diagnosis not present

## 2014-10-15 DIAGNOSIS — Z791 Long term (current) use of non-steroidal anti-inflammatories (NSAID): Secondary | ICD-10-CM | POA: Diagnosis not present

## 2014-10-15 DIAGNOSIS — K088 Other specified disorders of teeth and supporting structures: Secondary | ICD-10-CM | POA: Insufficient documentation

## 2014-10-15 MED ORDER — PENICILLIN V POTASSIUM 500 MG PO TABS
500.0000 mg | ORAL_TABLET | Freq: Four times a day (QID) | ORAL | Status: DC
Start: 2014-10-15 — End: 2015-01-16

## 2014-10-15 MED ORDER — IBUPROFEN 800 MG PO TABS
800.0000 mg | ORAL_TABLET | Freq: Once | ORAL | Status: AC
  Administered 2014-10-15: 800 mg via ORAL
  Filled 2014-10-15: qty 1

## 2014-10-15 MED ORDER — IBUPROFEN 800 MG PO TABS: 800.0000 mg | ORAL_TABLET | Freq: Three times a day (TID) | ORAL | Status: DC | PRN

## 2014-10-15 MED ORDER — TRAMADOL HCL 50 MG PO TABS
50.0000 mg | ORAL_TABLET | Freq: Once | ORAL | Status: AC
  Administered 2014-10-15: 50 mg via ORAL
  Filled 2014-10-15: qty 1

## 2014-10-15 MED ORDER — PENICILLIN V POTASSIUM 250 MG PO TABS
500.0000 mg | ORAL_TABLET | Freq: Once | ORAL | Status: AC
  Administered 2014-10-15: 500 mg via ORAL
  Filled 2014-10-15: qty 2

## 2014-10-15 MED ORDER — TRAMADOL HCL 50 MG PO TABS: 50.0000 mg | ORAL_TABLET | Freq: Four times a day (QID) | ORAL | Status: DC | PRN

## 2014-10-15 NOTE — Discharge Instructions (Signed)
You need to be taking penicillin 500 mg every 6 hours for 10 days. It looks like you were prescribed this medicine on March 4. We are prescribing it again for you today. Please take this medication as prescribed. Please follow-up with your dentist as scheduled on March 22nd. I am sending you home with tramadol and you will need to make this medication last until you can see your dentist.   Dental Caries Dental caries (also called tooth decay) is the most common oral disease. It can occur at any age but is more common in children and young adults.  HOW DENTAL CARIES DEVELOPS  The process of decay begins when bacteria and foods (particularly sugars and starches) combine in your mouth to produce plaque. Plaque is a substance that sticks to the hard, outer surface of a tooth (enamel). The bacteria in plaque produce acids that attack enamel. These acids may also attack the root surface of a tooth (cementum) if it is exposed. Repeated attacks dissolve these surfaces and create holes in the tooth (cavities). If left untreated, the acids destroy the other layers of the tooth.  RISK FACTORS  Frequent sipping of sugary beverages.   Frequent snacking on sugary and starchy foods, especially those that easily get stuck in the teeth.   Poor oral hygiene.   Dry mouth.   Substance abuse such as methamphetamine abuse.   Broken or poor-fitting dental restorations.   Eating disorders.   Gastroesophageal reflux disease (GERD).   Certain radiation treatments to the head and neck. SYMPTOMS In the early stages of dental caries, symptoms are seldom present. Sometimes white, chalky areas may be seen on the enamel or other tooth layers. In later stages, symptoms may include:  Pits and holes on the enamel.  Toothache after sweet, hot, or cold foods or drinks are consumed.  Pain around the tooth.  Swelling around the tooth. DIAGNOSIS  Most of the time, dental caries is detected during a regular  dental checkup. A diagnosis is made after a thorough medical and dental history is taken and the surfaces of your teeth are checked for signs of dental caries. Sometimes special instruments, such as lasers, are used to check for dental caries. Dental X-ray exams may be taken so that areas not visible to the eye (such as between the contact areas of the teeth) can be checked for cavities.  TREATMENT  If dental caries is in its early stages, it may be reversed with a fluoride treatment or an application of a remineralizing agent at the dental office. Thorough brushing and flossing at home is needed to aid these treatments. If it is in its later stages, treatment depends on the location and extent of tooth destruction:   If a small area of the tooth has been destroyed, the destroyed area will be removed and cavities will be filled with a material such as gold, silver amalgam, or composite resin.   If a large area of the tooth has been destroyed, the destroyed area will be removed and a cap (crown) will be fitted over the remaining tooth structure.   If the center part of the tooth (pulp) is affected, a procedure called a root canal will be needed before a filling or crown can be placed.   If most of the tooth has been destroyed, the tooth may need to be pulled (extracted). HOME CARE INSTRUCTIONS You can prevent, stop, or reverse dental caries at home by practicing good oral hygiene. Good oral hygiene includes:  Thoroughly cleaning your teeth at least twice a day with a toothbrush and dental floss.   Using a fluoride toothpaste. A fluoride mouth rinse may also be used if recommended by your dentist or health care provider.   Restricting the amount of sugary and starchy foods and sugary liquids you consume.   Avoiding frequent snacking on these foods and sipping of these liquids.   Keeping regular visits with a dentist for checkups and cleanings. PREVENTION   Practice good oral  hygiene.  Consider a dental sealant. A dental sealant is a coating material that is applied by your dentist to the pits and grooves of teeth. The sealant prevents food from being trapped in them. It may protect the teeth for several years.  Ask about fluoride supplements if you live in a community without fluorinated water or with water that has a low fluoride content. Use fluoride supplements as directed by your dentist or health care provider.  Allow fluoride varnish applications to teeth if directed by your dentist or health care provider. Document Released: 04/15/2002 Document Revised: 12/08/2013 Document Reviewed: 07/26/2012 [facility] Patient Information 2015 [place], [place]. This information is not intended to replace advice given to you by your health care provider. Make sure you discuss any questions you have with your health care provider.  Dental Care and Dentist Visits Dental care supports good overall health. Regular dental visits can also help you avoid dental pain, bleeding, infection, and other more serious health problems in the future. It is important to keep the mouth healthy because diseases in the teeth, gums, and other oral tissues can spread to other areas of the body. Some problems, such as diabetes, heart disease, and pre-term labor have been associated with poor oral health.  See your dentist every 6 months. If you experience emergency problems such as a toothache or broken tooth, go to the dentist right away. If you see your dentist regularly, you may catch problems early. It is easier to be treated for problems in the early stages.  WHAT TO EXPECT AT A DENTIST VISIT  Your dentist will look for many common oral health problems and recommend proper treatment. At your regular dental visit, you can expect:  Gentle cleaning of the teeth and gums. This includes scraping and polishing. This helps to remove the sticky substance around the teeth and gums (plaque). Plaque forms in the  mouth shortly after eating. Over time, plaque hardens on the teeth as tartar. If tartar is not removed regularly, it can cause problems. Cleaning also helps remove stains.  Periodic X-rays. These pictures of the teeth and supporting bone will help your dentist assess the health of your teeth.  Periodic fluoride treatments. Fluoride is a natural mineral shown to help strengthen teeth. Fluoride treatmentinvolves applying a fluoride gel or varnish to the teeth. It is most commonly done in children.  Examination of the mouth, tongue, jaws, teeth, and gums to look for any oral health problems, such as:  Cavities (dental caries). This is decay on the tooth caused by plaque, sugar, and acid in the mouth. It is best to catch a cavity when it is small.  Inflammation of the gums caused by plaque buildup (gingivitis).  Problems with the mouth or malformed or misaligned teeth.  Oral cancer or other diseases of the soft tissues or jaws. KEEP YOUR TEETH AND GUMS HEALTHY For healthy teeth and gums, follow these general guidelines as well as your dentist's specific advice:  Have your teeth professionally cleaned at  the dentist every 6 months.  Brush twice daily with a fluoride toothpaste.  Floss your teeth daily.  Ask your dentist if you need fluoride supplements, treatments, or fluoride toothpaste.  Eat a healthy diet. Reduce foods and drinks with added sugar.  Avoid smoking. TREATMENT FOR ORAL HEALTH PROBLEMS If you have oral health problems, treatment varies depending on the conditions present in your teeth and gums.  Your caregiver will most likely recommend good oral hygiene at each visit.  For cavities, gingivitis, or other oral health disease, your caregiver will perform a procedure to treat the problem. This is typically done at a separate appointment. Sometimes your caregiver will refer you to another dental specialist for specific tooth problems or for surgery. SEEK IMMEDIATE DENTAL  CARE IF:  You have pain, bleeding, or soreness in the gum, tooth, jaw, or mouth area.  A permanent tooth becomes loose or separated from the gum socket.  You experience a blow or injury to the mouth or jaw area. Document Released: 04/05/2011 Document Revised: 10/16/2011 Document Reviewed: 04/05/2011 [facility] Patient Information 2015 [place]. This information is not intended to replace advice given to you by your health care provider. Make sure you discuss any questions you have with your health care provider.

## 2014-10-15 NOTE — ED Provider Notes (Signed)
This chart was scribed for Erica MawKristen N Kallen Delatorre, DO by Bronson CurbJacqueline Melvin, ED Scribe. This patient was seen in room A10C/A10C and the patient's care was started at 1:45 AM.  TIME SEEN: 0145  CHIEF COMPLAINT: Dental Pain  HPI:   HPI Comments: Erica [name] is a 34 y.o. female with no significant past medical history who presents to the Emergency Department complaining of constant, 10/10, left lower dental pain onset 10 days ago, that has gotten worse in the last 24 hours. No specific A/A factors. No radiation of pain. Patient states her wisdom tooth is the source of the pain. [facility], for the same where she was prescribed Tramadol. She reports she did not receive enough pills to last her to her next dental appointment in 2 weeks, on the 22nd of this month. Patient is currently not any ABX, despite being presribed ABX at her last visit to the ED.She denies fever, nausea, vomiting, and diarrhea.   ROS: See HPI Constitutional: no fever  Eyes: no drainage  ENT: no runny nose   Cardiovascular:  no chest pain  Resp: no SOB  GI: no vomiting GU: no dysuria Integumentary: no rash  Allergy: no hives  Musculoskeletal: no leg swelling  Neurological: no slurred speech ROS otherwise negative  PAST MEDICAL HISTORY/PAST SURGICAL HISTORY:  Past Medical History  Diagnosis Date  . No pertinent past medical history   . Abnormal Pap smear     MEDICATIONS:  Prior to Admission medications   Medication Sig Start Date End Date Taking? Authorizing Provider  cyclobenzaprine (FLEXERIL) 10 MG tablet Take 1 tablet (10 mg total) by mouth 2 (two) times daily as needed for muscle spasms. Patient not taking: Reported on 09/29/2014 12/11/13   Roxy Horsemanobert Browning, PA-C  diazepam (VALIUM) 5 MG tablet Take 1 tablet (5 mg total) by mouth 2 (two) times daily. Patient not taking: Reported on 09/29/2014 12/17/13   Elson AreasLeslie K Sofia, PA-C  diazepam (VALIUM) 5 MG tablet Take 1 tablet (5 mg  total) by mouth every 12 (twelve) hours as needed for muscle spasms. Patient not taking: Reported on 09/29/2014 01/10/14   Roma KayserKatherine P Schorr, NP  HYDROcodone-acetaminophen (NORCO/VICODIN) 5-325 MG per tablet Take 1 tablet by mouth every 4 (four) hours as needed. 10/09/14   Everlene FarrierWilliam Dansie, PA-C  ibuprofen (ADVIL,MOTRIN) 800 MG tablet Take 1 tablet (800 mg total) by mouth 3 (three) times daily. 09/29/14   Elpidio AnisShari Upstill, PA-C  penicillin v potassium (VEETID) 500 MG tablet Take 1 tablet (500 mg total) by mouth 4 (four) times daily. 10/09/14   Everlene FarrierWilliam Dansie, PA-C  permethrin (ELIMITE) 5 % cream Apply to affected area once Patient not taking: Reported on 09/29/2014 12/17/13   Elson AreasLeslie K Sofia, PA-C  Pseudoephedrine-DM-GG (ROBITUSSIN CF PO) Take 2.5 mLs by mouth once.    Historical Provider, MD  traMADol (ULTRAM) 50 MG tablet Take 1 tablet (50 mg total) by mouth every 6 (six) hours as needed. 01/10/14   Leanne ChangKatherine P Schorr, NP    ALLERGIES:  No Known Allergies  SOCIAL HISTORY:  History  Substance Use Topics  . Smoking status: Current Some Day Smoker -- 1.00 packs/day for .5 years  . Smokeless tobacco: Never Used  . Alcohol Use: No    FAMILY HISTORY: Family History  Problem Relation Age of Onset  . Arthritis Mother   . Hypertension Mother   . Diabetes Mother   . Asthma Mother   . Arthritis Father   . Hypertension  Father   . Diabetes Father     EXAM:  Triage Vitals: BP 107/50 mmHg  Pulse 77  Temp(Src) 98.3 F (36.8 C) (Oral)  Resp 18  Ht  (1.651 m)  Wt 197 lb (89.359 kg)  BMI 32.78 kg/m2  SpO2 98%  LMP 09/20/2014  CONSTITUTIONAL: Alert and oriented and responds appropriately to questions. Well-appearing; well-nourished. Nontoxic. No distress. HEAD: Normocephalic. EYES: Conjunctivae clear, PERRL ENT: normal nose; no rhinorrhea; moist mucous membranes; pharynx without lesions noted. Left lower 3rd molar is fractured with surrounding gum swelling but no erythema. No signs of abscess. No  drainage. No trismus or drooling. Normal phonation. No submandibular swelling. Tongu sits flat in bottom of mouth. No uvular devaition. NECK: Supple, no meningismus, no LAD  CARD: RRR; S1 and S2 appreciated; no murmurs, no clicks, no rubs, no gallops RESP: Normal chest excursion without splinting or tachypnea; breath sounds clear and equal bilaterally; no wheezes, no rhonchi, no rales,  ABD/GI: Normal bowel sounds; non-distended; soft, non-tender, no rebound, no guarding BACK:  The back appears normal and is non-tender to palpation, there is no CVA tenderness EXT: Normal ROM in all joints; non-tender to palpation; no edema; normal capillary refill; no cyanosis    SKIN: Normal color for age and race; warm NEURO: Moves all extremities equally PSYCH: The patient's mood and manner are appropriate. Grooming and personal hygiene are appropriate.  MEDICAL DECISION MAKING: Patient here with dental pain from a third left posterior molar. No obvious sign of abscess. No sign of Ludwig angina. Nontoxic. Hemodynamically stable. Will give another prescription for penicillin have advised patient to start taking this. We'll also refill her tramadol and ibuprofen. Have advised her that she will need to make this medication last until her appointment on March 22 with oral surgeon Dr. Barbette Merino. Discussed return precautions. She verbalized understanding and is comfortable with plan.    I personally performed the services described in this documentation, which was scribed in my presence. The recorded information has been reviewed and is accurate.    Erica Maw Asaiah Scarber, DO 10/15/14 [phone]

## 2014-10-15 NOTE — ED Notes (Signed)
Pt c/o tooth pain from wisdom tooth coming in. States that the pain started today. [facility] prescribed [facility]ad[facility]. Pt states that she did not receive enough pills for her to be able to get in to see a dentist.

## 2015-01-15 ENCOUNTER — Emergency Department (HOSPITAL_COMMUNITY): Payer: Medicaid Other

## 2015-01-15 ENCOUNTER — Encounter (HOSPITAL_COMMUNITY): Payer: Self-pay | Admitting: *Deleted

## 2015-01-15 ENCOUNTER — Emergency Department (HOSPITAL_COMMUNITY)
Admission: EM | Admit: 2015-01-15 | Discharge: 2015-01-16 | Disposition: A | Discharge status: Home / Self Care | Payer: Medicaid Other | Attending: Emergency Medicine | Admitting: Emergency Medicine

## 2015-01-15 DIAGNOSIS — R079 Chest pain, unspecified: Secondary | ICD-10-CM | POA: Diagnosis present

## 2015-01-15 DIAGNOSIS — R0789 Other chest pain: Secondary | ICD-10-CM | POA: Diagnosis not present

## 2015-01-15 DIAGNOSIS — R0602 Shortness of breath: Secondary | ICD-10-CM | POA: Insufficient documentation

## 2015-01-15 DIAGNOSIS — Z8709 Personal history of other diseases of the respiratory system: Secondary | ICD-10-CM | POA: Diagnosis not present

## 2015-01-15 DIAGNOSIS — Z87891 Personal history of nicotine dependence: Secondary | ICD-10-CM | POA: Insufficient documentation

## 2015-01-15 DIAGNOSIS — Z3202 Encounter for pregnancy test, result negative: Secondary | ICD-10-CM | POA: Diagnosis not present

## 2015-01-15 HISTORY — DX: Bronchitis, not specified as acute or chronic: J40

## 2015-01-15 LAB — BASIC METABOLIC PANEL
Anion gap: 8 (ref 5–15)
BUN: <5 mg/dL — ABNORMAL LOW (ref 6–20)
CHLORIDE: 105 mmol/L (ref 101–111)
CO2: 25 mmol/L (ref 22–32)
Calcium: 8.7 mg/dL — ABNORMAL LOW (ref 8.9–10.3)
Creatinine, Ser: 0.88 mg/dL (ref 0.44–1.00)
GFR calc Af Amer: >60 mL/min (ref >60)
GLUCOSE: 100 mg/dL — AB (ref 65–99)
POTASSIUM: 3.3 mmol/L — AB (ref 3.5–5.1)
Sodium: 138 mmol/L (ref 135–145)

## 2015-01-15 LAB — CBC WITH DIFFERENTIAL/PLATELET
BASOS PCT: 0 % (ref 0–1)
Basophils Absolute: 0 10*3/uL (ref 0.0–0.1)
EOS PCT: 2 % (ref 0–5)
Eosinophils Absolute: 0.1 10*3/uL (ref 0.0–0.7)
HCT: 32.2 % — ABNORMAL LOW (ref 36.0–46.0)
Hemoglobin: 10.2 g/dL — ABNORMAL LOW (ref 12.0–15.0)
LYMPHS ABS: 3.7 10*3/uL (ref 0.7–4.0)
LYMPHS PCT: 50 % — AB (ref 12–46)
MCH: 24.7 pg — ABNORMAL LOW (ref 26.0–34.0)
MCHC: 31.7 g/dL (ref 30.0–36.0)
MCV: 78 fL (ref 78.0–100.0)
Monocytes Absolute: 0.4 10*3/uL (ref 0.1–1.0)
Monocytes Relative: 5 % (ref 3–12)
Neutro Abs: 3.2 10*3/uL (ref 1.7–7.7)
Neutrophils Relative %: 43 % (ref 43–77)
Platelets: 322 10*3/uL (ref 150–400)
RBC: 4.13 MIL/uL (ref 3.87–5.11)
RDW: 14.2 % (ref 11.5–15.5)
WBC: 7.4 10*3/uL (ref 4.0–10.5)

## 2015-01-15 LAB — I-STAT TROPONIN, ED: TROPONIN I, POC: 0 ng/mL (ref 0.00–0.08)

## 2015-01-15 LAB — POC URINE PREG, ED: Preg Test, Ur: NEGATIVE

## 2015-01-15 NOTE — ED Notes (Signed)
Patient presents with c/o central chest pain that gets "worser when i push on my chest and then I get lethargic"

## 2015-01-16 MED ORDER — IBUPROFEN 800 MG PO TABS
800.0000 mg | ORAL_TABLET | Freq: Once | ORAL | Status: AC
  Administered 2015-01-16: 800 mg via ORAL
  Filled 2015-01-16: qty 1

## 2015-01-16 MED ORDER — IBUPROFEN 800 MG PO TABS: 800.0000 mg | ORAL_TABLET | Freq: Three times a day (TID) | ORAL | Status: DC

## 2015-01-16 MED ORDER — HYDROCODONE-ACETAMINOPHEN 5-325 MG PO TABS
2.0000 | ORAL_TABLET | Freq: Once | ORAL | Status: AC
  Administered 2015-01-16: 2 via ORAL
  Filled 2015-01-16: qty 2

## 2015-01-16 MED ORDER — TRAMADOL HCL 50 MG PO TABS
50.0000 mg | ORAL_TABLET | Freq: Four times a day (QID) | ORAL | Status: DC | PRN
Start: 2015-01-16 — End: 2015-02-22

## 2015-01-16 NOTE — ED Notes (Signed)
Pt verbalized understanding of d/c instructions and has no further questions. Pt in no acute distress upon d/c 

## 2015-01-16 NOTE — ED Provider Notes (Signed)
CSN: [id number]     Arrival date & time 01/15/15  2223 History   First MD Initiated Contact with Patient 01/15/15 2356     Chief Complaint  Patient presents with  . Chest Pain     (Consider location/radiation/quality/duration/timing/severity/associated sxs/prior Treatment) HPI Comments: Patient is a 34 year old female with a history of bronchitis who presents to the emergency department for further evaluation of central chest pain. Patient states that chest pain has been constant over the last 3 days. Chest pain is waxing and waning in severity and pressure-like. Patient denies any radiation of the pain. She states that symptoms worsen when pushing on her chest, coughing, and deep breathing. She denies any recent strenuous activity or heavy lifting. She reports some mild shortness of breath. No associated fever, syncope, nausea, vomiting, extremity numbness/weakness, abdominal pain, or leg swelling. No personal history of ACS, hypertension, hyperlipidemia, or diabetes mellitus. Patient is a former smoker; she quit smoking 2 months ago. She reports a family history of ACS in her mother at the age of 60. No other family history of ACS. No personal or family history of DVT/PE. No use of birth control.  Patient is a 34 y.o. female presenting with chest pain. The history is provided by the patient. No language interpreter was used.  Chest Pain Associated symptoms: shortness of breath   Associated symptoms: no fever, no nausea and not vomiting     Past Medical History  Diagnosis Date  . No pertinent past medical history   . Abnormal Pap smear   . Bronchitis    Past Surgical History  Procedure Laterality Date  . No past surgeries     Family History  Problem Relation Age of Onset  . Arthritis Mother   . Hypertension Mother   . Diabetes Mother   . Asthma Mother   . Arthritis Father   . Hypertension Father   . Diabetes Father    History  Substance Use Topics  . Smoking status: Former  Smoker -- 1.00 packs/day for .5 years  . Smokeless tobacco: Never Used  . Alcohol Use: No   OB History    Gravida Para Term Preterm AB TAB SAB Ectopic Multiple Living   Review of Systems  Constitutional: Negative for fever.  Respiratory: Positive for shortness of breath.   Cardiovascular: Positive for chest pain.  Gastrointestinal: Negative for nausea and vomiting.  Neurological: Negative for syncope.  All other systems reviewed and are negative.   Allergies  Review of patient's allergies indicates no known allergies.  Home Medications   Prior to Admission medications   Medication Sig Start Date End Date Taking? Authorizing Provider  ibuprofen (ADVIL,MOTRIN) 800 MG tablet Take 1 tablet (800 mg total) by mouth every 8 (eight) hours. 01/16/15   Antony Madura, PA-C  traMADol (ULTRAM) 50 MG tablet Take 1 tablet (50 mg total) by mouth every 6 (six) hours as needed. 01/16/15   Antony Madura, PA-C   BP 118/72 mmHg  Pulse 65  Temp(Src) 98.5 F (36.9 C) (Oral)  Resp 11  Ht  (1.651 m)  Wt 185 lb (83.915 kg)  BMI 30.79 kg/m2  SpO2 100%  LMP 01/11/2015 Physical Exam  Constitutional: She is oriented to person, place, and time. She appears well-developed and well-nourished. No distress.  Nontoxic/nonseptic appearing  HENT:  Head: Normocephalic and atraumatic.  Eyes: Conjunctivae and EOM are normal. No scleral icterus.  Neck: Normal  range of motion.  No JVD noted  Cardiovascular: Normal rate, regular rhythm and intact distal pulses.   Pulmonary/Chest: Effort normal and breath sounds normal. No respiratory distress. She has no wheezes. She has no rales. She exhibits tenderness.  Respirations even and unlabored. Chest expansion symmetric. Tenderness to palpation of the central chest wall without crepitus or deformity. Patient also with discomfort when changing from a supine to upright position secondary to activation of muscles in her chest wall.  Musculoskeletal:  Normal range of motion.  No pitting edema  Neurological: She is alert and oriented to person, place, and time. She exhibits normal muscle tone. Coordination normal.  GCS 15. Patient moving all extremities.  Skin: Skin is warm and dry. No rash noted. She is not diaphoretic. No erythema. No pallor.  Psychiatric: She has a normal mood and affect. Her behavior is normal.  Nursing note and vitals reviewed.   ED Course  Procedures (including critical care time) Labs Review Labs Reviewed  BASIC METABOLIC PANEL - Abnormal; Notable for the following:    Potassium 3.3 (*)    Glucose, Bld 100 (*)    BUN <5 (*)    Calcium 8.7 (*)    All other components within normal limits  CBC WITH DIFFERENTIAL/PLATELET - Abnormal; Notable for the following:    Hemoglobin 10.2 (*)    HCT 32.2 (*)    MCH 24.7 (*)    Lymphocytes Relative 50 (*)    All other components within normal limits  I-STAT TROPOININ, ED  POC URINE PREG, ED    Imaging Review Dg Chest 2 View  01/15/2015   CLINICAL DATA:  Acute onset of chest pain for 3 days. Initial encounter.  EXAM: CHEST  2 VIEW  COMPARISON:  Chest radiograph performed 01/01/2010  FINDINGS: The lungs are well-aerated and clear. There is no evidence of focal opacification, pleural effusion or pneumothorax.  The heart is normal in size; the mediastinal contour is within normal limits. No acute osseous abnormalities are seen.  IMPRESSION: No acute cardiopulmonary process seen.   Electronically Signed   By: Roanna Raider M.D.   On: 01/15/2015 23:01     EKG Interpretation   Date/Time:  Friday January 15 2015 22:35:04 EDT Ventricular Rate:  65 PR Interval:  142 QRS Duration: 70 QT Interval:  410 QTC Calculation: 426 R Axis:   66 Text Interpretation:  Normal sinus rhythm Cannot rule out Anterior infarct  , age undetermined Abnormal ECG No old tracing to compare Confirmed by  Rhunette Croft, MD, Janey Genta [phone]) on 01/16/2015 12:02:06 AM      MDM   Final diagnoses:  Chest  wall pain    34 year old female presents to the emergency department for further evaluation of chest pain 3 days. Pain has been constant since onset. EKG today is nonischemic. Troponin is negative. Chest x-ray shows no evidence of pneumothorax, pneumonia, pleural effusion, or other acute cardiopulmonary process. Doubt ACS, especially given reproducibility of symptoms. Heart score is 1 (hx smoking until 2 months ago) c/w low risk of acute coronary event. Doubt PE; patient is PERC negative. Also doubt dissection.  Symptoms likely secondary to a chest wall strain. Symptoms will be managed as outpatient with ibuprofen and tramadol as needed for severe pain. Supportive treatment advised and primary care follow-up recommended in 1 week. Return precautions discussed and provided. Patient agreeable to plan with no unaddressed concerns. Patient discharged in good condition; VSS.   Filed Vitals:   01/15/15 2234 01/15/15 2322 01/15/15 2330 01/15/15  2345  BP: 114/72 102/69 105/70 118/72  Pulse: 65 60 67 65  Temp: 98.5 F (36.9 C)     TempSrc: Oral     Resp: 18 16 13 11   Height: 5\' 5"  (1.651 m)     Weight: 185 lb (83.915 kg)     SpO2: 99% 99% 99% 100%     Antony Madura, PA-C 01/16/15 0034  Derwood Kaplan, MD 01/17/15 0007

## 2015-01-16 NOTE — Discharge Instructions (Signed)

## 2015-02-11 ENCOUNTER — Emergency Department (HOSPITAL_COMMUNITY): Payer: Medicaid Other

## 2015-02-11 ENCOUNTER — Emergency Department (HOSPITAL_COMMUNITY)
Admission: EM | Admit: 2015-02-11 | Discharge: 2015-02-11 | Disposition: A | Discharge status: Home / Self Care | Payer: Medicaid Other | Attending: Emergency Medicine | Admitting: Emergency Medicine

## 2015-02-11 ENCOUNTER — Encounter (HOSPITAL_COMMUNITY): Payer: Self-pay | Admitting: Emergency Medicine

## 2015-02-11 DIAGNOSIS — R0602 Shortness of breath: Secondary | ICD-10-CM | POA: Diagnosis not present

## 2015-02-11 DIAGNOSIS — R0789 Other chest pain: Secondary | ICD-10-CM | POA: Diagnosis not present

## 2015-02-11 DIAGNOSIS — Z8709 Personal history of other diseases of the respiratory system: Secondary | ICD-10-CM | POA: Diagnosis not present

## 2015-02-11 DIAGNOSIS — R05 Cough: Secondary | ICD-10-CM | POA: Diagnosis not present

## 2015-02-11 DIAGNOSIS — Z87891 Personal history of nicotine dependence: Secondary | ICD-10-CM | POA: Insufficient documentation

## 2015-02-11 DIAGNOSIS — R079 Chest pain, unspecified: Secondary | ICD-10-CM | POA: Diagnosis present

## 2015-02-11 LAB — TROPONIN I: Troponin I: <0.03 ng/mL (ref <0.031)

## 2015-02-11 MED ORDER — KETOROLAC TROMETHAMINE 60 MG/2ML IM SOLN
60.0000 mg | Freq: Once | INTRAMUSCULAR | Status: AC
  Administered 2015-02-11: 60 mg via INTRAMUSCULAR
  Filled 2015-02-11: qty 2

## 2015-02-11 MED ORDER — IBUPROFEN 600 MG PO TABS: 600.0000 mg | ORAL_TABLET | Freq: Four times a day (QID) | ORAL | Status: DC | PRN

## 2015-02-11 NOTE — Discharge Instructions (Signed)
You were seen today for chest pain. Her chest pain is reproducible on exam. This suggests a chest wall component. Your EKG and other vital signs and workup are reassuring. You should follow-up with her primary physician.  Chest Wall Pain Chest wall pain is pain in or around the bones and muscles of your chest. It may take up to 6 weeks to get better. It may take longer if you must stay physically active in your work and activities.  CAUSES  Chest wall pain may happen on its own. However, it may be caused by:  A viral illness like the flu.  Injury.  Coughing.  Exercise.  Arthritis.  Fibromyalgia.  Shingles. HOME CARE INSTRUCTIONS   Avoid overtiring physical activity. Try not to strain or perform activities that cause pain. This includes any activities using your chest or your abdominal and side muscles, especially if heavy weights are used.  Put ice on the sore area.  Put ice in a plastic bag.  Place a towel between your skin and the bag.  Leave the ice on for 15-20 minutes per hour while awake for the first 2 days.  Only take over-the-counter or prescription medicines for pain, discomfort, or fever as directed by your caregiver. SEEK IMMEDIATE MEDICAL CARE IF:   Your pain increases, or you are very uncomfortable.  You have a fever.  Your chest pain becomes worse.  You have new, unexplained symptoms.  You have nausea or vomiting.  You feel sweaty or lightheaded.  You have a cough with phlegm (sputum), or you cough up blood. MAKE SURE YOU:   Understand these instructions.  Will watch your condition.  Will get help right away if you are not doing well or get worse. Document Released: 07/24/2005 Document Revised: 10/16/2011 Document Reviewed: 03/20/2011 [facility] Patient Information 2015 [place], [place]. This information is not intended to replace advice given to you by your health care provider. Make sure you discuss any questions you have with your health care  provider.

## 2015-02-11 NOTE — ED Notes (Signed)
Pt reports substernal chest pain that started yesterday around 1500. Pt states that her chest pain worsened this AM around 0300. Pt states that she is also experiencing SOB and lightheadedness. Pt reports that she was seen and treated here two weeks ago for the same, and was told to come back if her pain returned.

## 2015-02-11 NOTE — ED Provider Notes (Signed)
CSN: [id number]     Arrival date & time 02/11/15  [id number] History   First MD Initiated Contact with Patient 02/11/15 0444     Chief Complaint  Patient presents with  . Chest Pain  . Shortness of Breath     (Consider location/radiation/quality/duration/timing/severity/associated sxs/prior Treatment) HPI  This is a 34 year old female who is a former smoker but no other known past medical history who presents with chest pain. She reports onset of chest pain yesterday at 3 PM. She states that it is sharp and worse with certain movements and positioning.  She also reports shortness of breath. Intermittent dry cough. No fevers. Currently she rates her pain a 10 out of 10. It is not worse with breathing. She's not currently on birth control, has not had any recent travel or hospitalization. She had similar pain 2 weeks ago and was evaluated here for the same. At that time she was diagnosed with chest wall pain.  Past Medical History  Diagnosis Date  . No pertinent past medical history   . Abnormal Pap smear   . Bronchitis    Past Surgical History  Procedure Laterality Date  . No past surgeries     Family History  Problem Relation Age of Onset  . Arthritis Mother   . Hypertension Mother   . Diabetes Mother   . Asthma Mother   . Arthritis Father   . Hypertension Father   . Diabetes Father    History  Substance Use Topics  . Smoking status: Former Smoker -- 1.00 packs/day for .5 years  . Smokeless tobacco: Never Used  . Alcohol Use: Yes     Comment: Occassionally.    OB History    Gravida Para Term Preterm AB TAB SAB Ectopic Multiple Living   3 2 2  1  1   2      Review of Systems  Constitutional: Negative for fever.  Respiratory: Positive for cough and shortness of breath. Negative for chest tightness.   Cardiovascular: Positive for chest pain.  Gastrointestinal: Negative for nausea, vomiting and abdominal pain.  Genitourinary: Negative for dysuria.  Musculoskeletal: Negative  for back pain.  Neurological: Negative for headaches.  All other systems reviewed and are negative.     Allergies  Review of patient's allergies indicates no known allergies.  Home Medications   Prior to Admission medications   Medication Sig Start Date End Date Taking? Authorizing Provider  ibuprofen (ADVIL,MOTRIN) 600 MG tablet Take 1 tablet (600 mg total) by mouth every 6 (six) hours as needed. 02/11/15   Shon Batonourtney F Horton, MD  traMADol (ULTRAM) 50 MG tablet Take 1 tablet (50 mg total) by mouth every 6 (six) hours as needed. Patient not taking: Reported on 02/11/2015 01/16/15   Antony MaduraKelly Humes, PA-C   BP 95/55 mmHg  Pulse 66  Temp(Src) 98.2 F (36.8 C) (Oral)  Resp 19  SpO2 100%  LMP 01/11/2015 Physical Exam  Constitutional: She is oriented to person, place, and time. She appears well-developed and well-nourished. No distress.  HENT:  Head: Normocephalic and atraumatic.  Cardiovascular: Normal rate, regular rhythm and normal heart sounds.   No murmur heard. Pulmonary/Chest: Effort normal and breath sounds normal. No respiratory distress. She has no wheezes. She exhibits tenderness.  Abdominal: Soft. Bowel sounds are normal. There is no tenderness. There is no rebound.  Musculoskeletal: She exhibits no edema.  Neurological: She is alert and oriented to person, place, and time.  Skin: Skin is warm and dry.  Psychiatric: She  has a normal mood and affect.  Nursing note and vitals reviewed.   ED Course  Procedures (including critical care time) Labs Review Labs Reviewed  TROPONIN I    Imaging Review Dg Chest 2 View  02/11/2015   CLINICAL DATA:  Chest pain  EXAM: CHEST  2 VIEW  COMPARISON:  01/15/2015  FINDINGS: Normal heart size and mediastinal contours. No acute infiltrate or edema. No effusion or pneumothorax. No acute osseous findings.  IMPRESSION: Negative chest.   Electronically Signed   By: Marnee Spring M.D.   On: 02/11/2015 06:22     EKG  Interpretation   Date/Time:  Thursday February 11 2015 04:44:18 EDT Ventricular Rate:  63 PR Interval:  150 QRS Duration: 81 QT Interval:  423 QTC Calculation: 433 R Axis:   70 Text Interpretation:  Sinus rhythm Confirmed by HORTON  MD, COURTNEY  ([id number]) on 02/11/2015 4:45:59 AM      MDM   Final diagnoses:  Chest wall pain    Patient presents for chest pain. Nontoxic on exam. Vital signs are reassuring. She has reproducible chest wall pain on exam. EKG is unchanged from prior. Troponin negative. Chest x-ray shows no evidence of pneumothorax or pneumonia. Patient is PERC negative. Patient was given Toradol.  Workup is reassuring. On recheck, patient comfortable and resting. Discussed with her the workup and plan. We'll discharge home with anti-inflammatories.  After history, exam, and medical workup I feel the patient has been appropriately medically screened and is safe for discharge home. Pertinent diagnoses were discussed with the patient. Patient was given return precautions.     Shon Baton, MD 02/11/15 [phone]

## 2015-02-22 ENCOUNTER — Emergency Department (HOSPITAL_COMMUNITY)
Admission: EM | Admit: 2015-02-22 | Discharge: 2015-02-22 | Disposition: A | Discharge status: Home / Self Care | Payer: Medicaid Other | Attending: Emergency Medicine | Admitting: Emergency Medicine

## 2015-02-22 ENCOUNTER — Emergency Department (HOSPITAL_COMMUNITY): Payer: Medicaid Other

## 2015-02-22 ENCOUNTER — Encounter (HOSPITAL_COMMUNITY): Payer: Self-pay | Admitting: Emergency Medicine

## 2015-02-22 DIAGNOSIS — R079 Chest pain, unspecified: Secondary | ICD-10-CM | POA: Diagnosis present

## 2015-02-22 DIAGNOSIS — R0789 Other chest pain: Secondary | ICD-10-CM | POA: Insufficient documentation

## 2015-02-22 DIAGNOSIS — R509 Fever, unspecified: Secondary | ICD-10-CM | POA: Diagnosis not present

## 2015-02-22 DIAGNOSIS — Z87891 Personal history of nicotine dependence: Secondary | ICD-10-CM | POA: Diagnosis not present

## 2015-02-22 LAB — I-STAT TROPONIN, ED: Troponin i, poc: 0 ng/mL (ref 0.00–0.08)

## 2015-02-22 LAB — BASIC METABOLIC PANEL
ANION GAP: 7 (ref 5–15)
BUN: 7 mg/dL (ref 6–20)
CHLORIDE: 105 mmol/L (ref 101–111)
CO2: 25 mmol/L (ref 22–32)
Calcium: 9.2 mg/dL (ref 8.9–10.3)
Creatinine, Ser: 0.9 mg/dL (ref 0.44–1.00)
GFR calc Af Amer: >60 mL/min (ref >60)
Glucose, Bld: 108 mg/dL — ABNORMAL HIGH (ref 65–99)
Potassium: 3.6 mmol/L (ref 3.5–5.1)
Sodium: 137 mmol/L (ref 135–145)

## 2015-02-22 LAB — CBC
HCT: 34 % — ABNORMAL LOW (ref 36.0–46.0)
Hemoglobin: 10.6 g/dL — ABNORMAL LOW (ref 12.0–15.0)
MCH: 24.4 pg — ABNORMAL LOW (ref 26.0–34.0)
MCHC: 31.2 g/dL (ref 30.0–36.0)
MCV: 78.3 fL (ref 78.0–100.0)
PLATELETS: 207 10*3/uL (ref 150–400)
RBC: 4.34 MIL/uL (ref 3.87–5.11)
RDW: 14.2 % (ref 11.5–15.5)
WBC: 7.7 10*3/uL (ref 4.0–10.5)

## 2015-02-22 MED ORDER — KETOROLAC TROMETHAMINE 30 MG/ML IJ SOLN
60.0000 mg | Freq: Once | INTRAMUSCULAR | Status: AC
  Administered 2015-02-22: 60 mg via INTRAMUSCULAR
  Filled 2015-02-22: qty 2

## 2015-02-22 MED ORDER — IBUPROFEN 800 MG PO TABS: 800.0000 mg | ORAL_TABLET | Freq: Three times a day (TID) | ORAL | Status: DC | PRN

## 2015-02-22 NOTE — Discharge Instructions (Signed)

## 2015-02-22 NOTE — ED Notes (Signed)
Patient here with complaint of substernal to left sided chest pain starting around 0130. States history of similar pain and was seen and told that "everything is okay". Began tonight while laying down watching television. Denies other symptoms.

## 2015-02-22 NOTE — ED Provider Notes (Signed)
This chart was scribed for  Erica [name] Erica Stovall, DO by [name], ED Scribe. This patient was seen in room B18C/B18C and the patient's care was started at 3:16 AM.  TIME SEEN: 3:16 AM  CHIEF COMPLAINT: Chest pain  HPI: Erica [name] is a 34 y.o. female who presents to the Emergency Department complaining of intermittent  left-sided chest pain with onset over 1 month ago. She was seen in the ED 2 weeks ago and notes resolution. The pain returned last night.  She describes the pain as "like needles" and notes that it is exacerbated by lifting the arm, deep breathing, and laying on the left side. Associated symptoms include fever 2 weeks ago (none today). No history of PE or DVT, recent travel, fracture, estrogen use, surgery, or hospital stay. Tobacco+. No family history of CAD.    ROS: See HPI Constitutional: no fever  Eyes: no drainage  ENT: no runny nose   Cardiovascular: chest pain  Resp: no SOB  GI: no vomiting GU: no dysuria Integumentary: no rash  Allergy: no hives  Musculoskeletal: no leg swelling  Neurological: no slurred speech ROS otherwise negative  PAST MEDICAL HISTORY/PAST SURGICAL HISTORY:  Past Medical History  Diagnosis Date  . No pertinent past medical history   . Abnormal Pap smear   . Bronchitis     MEDICATIONS:  Prior to Admission medications   Medication Sig Start Date End Date Taking? Authorizing Provider  ibuprofen (ADVIL,MOTRIN) 600 MG tablet Take 1 tablet (600 mg total) by mouth every 6 (six) hours as needed. Patient not taking: Reported on 02/22/2015 02/11/15   Shon Baton, MD  traMADol (ULTRAM) 50 MG tablet Take 1 tablet (50 mg total) by mouth every 6 (six) hours as needed. Patient not taking: Reported on 02/11/2015 01/16/15   Antony Madura, PA-C    ALLERGIES:  No Known Allergies  SOCIAL HISTORY:  History  Substance Use Topics  . Smoking status: Former Smoker -- 1.00 packs/day for .5 years  . Smokeless tobacco: Never Used  . Alcohol Use: Yes      Comment: Occassionally.     FAMILY HISTORY: Family History  Problem Relation Age of Onset  . Arthritis Mother   . Hypertension Mother   . Diabetes Mother   . Asthma Mother   . Arthritis Father   . Hypertension Father   . Diabetes Father     EXAM: BP 102/67 mmHg  Pulse 77  Temp(Src) 98.4 F (36.9 C) (Oral)  Resp 18  Ht  (1.651 m)  Wt 181 lb (82.101 kg)  BMI 30.12 kg/m2  SpO2 99%  LMP 02/08/2015 (Exact Date) CONSTITUTIONAL: Alert and oriented and responds appropriately to questions. Well-appearing; well-nourished HEAD: Normocephalic EYES: Conjunctivae clear, PERRL ENT: normal nose; no rhinorrhea; moist mucous membranes; pharynx without lesions noted NECK: Supple, no meningismus, no LAD  CARD: RRR; S1 and S2 appreciated; no murmurs, no clicks, no rubs, no gallops Chest: TTP over left chest wall without crepitus, ecchymosis, or deformity RESP: Normal chest excursion without splinting or tachypnea; breath sounds clear and equal bilaterally; no wheezes, no rhonchi, no rales, no hypoxia or respiratory distress ABD/GI: Normal bowel sounds; non-distended; soft, non-tender, no rebound, no guarding BACK:  The back appears normal and is non-tender to palpation, there is no CVA tenderness EXT: Normal ROM in all joints; non-tender to palpation; no edema; normal capillary refill; no cyanosis    SKIN: Normal color for age and race; warm NEURO: Moves all extremities equally PSYCH: The patient's  mood and manner are appropriate. Grooming and personal hygiene are appropriate.  MEDICAL DECISION MAKING: Patient here with chest wall pain. Labs ordered in triage are unremarkable including negative troponin. Chest x-ray clear. Given Toradol in the ED and will discharge home on ibuprofen given this has helped her in the past. She has no risk factors for ACS other than tobacco use and is PERC negative.  She has slightly low blood pressure but this appears chronic for patient. She does have  outpatient follow-up. Discussed return precautions. She verbalized understanding and is comfortable with plan. Discussed with her the do not feel there is any life-threatening process present.     EKG Interpretation  Date/Time:  Monday February 22 2015 02:45:31 EDT Ventricular Rate:  75 PR Interval:  138 QRS Duration: 76 QT Interval:  394 QTC Calculation: 439 R Axis:   63 Text Interpretation:  Normal sinus rhythm Cannot rule out Anterior infarct , age undetermined Abnormal ECG No significant change since last tracing Confirmed by Clista Rainford,  DO, Suzannah Bettes ([id number]) on 02/22/2015 3:11:55 AM        I personally performed the services described in this documentation, which was scribed in my presence. The recorded information has been reviewed and is accurate.    Erica MawKristen N Ammon Muscatello, DO 02/22/15 [phone]

## 2015-03-09 ENCOUNTER — Emergency Department (HOSPITAL_COMMUNITY): Payer: Medicaid Other

## 2015-03-09 ENCOUNTER — Emergency Department (HOSPITAL_COMMUNITY)
Admission: EM | Admit: 2015-03-09 | Discharge: 2015-03-09 | Disposition: A | Discharge status: Home / Self Care | Payer: Medicaid Other | Attending: Emergency Medicine | Admitting: Emergency Medicine

## 2015-03-09 ENCOUNTER — Encounter (HOSPITAL_COMMUNITY): Payer: Self-pay | Admitting: Emergency Medicine

## 2015-03-09 DIAGNOSIS — R0789 Other chest pain: Secondary | ICD-10-CM | POA: Insufficient documentation

## 2015-03-09 DIAGNOSIS — Z87891 Personal history of nicotine dependence: Secondary | ICD-10-CM | POA: Insufficient documentation

## 2015-03-09 DIAGNOSIS — R109 Unspecified abdominal pain: Secondary | ICD-10-CM | POA: Diagnosis not present

## 2015-03-09 DIAGNOSIS — R079 Chest pain, unspecified: Secondary | ICD-10-CM | POA: Diagnosis present

## 2015-03-09 DIAGNOSIS — J209 Acute bronchitis, unspecified: Secondary | ICD-10-CM | POA: Diagnosis not present

## 2015-03-09 DIAGNOSIS — R042 Hemoptysis: Secondary | ICD-10-CM | POA: Diagnosis not present

## 2015-03-09 LAB — HEPATIC FUNCTION PANEL
ALBUMIN: 3.7 g/dL (ref 3.5–5.0)
ALT: 12 U/L — ABNORMAL LOW (ref 14–54)
AST: 20 U/L (ref 15–41)
Alkaline Phosphatase: 47 U/L (ref 38–126)
Bilirubin, Direct: <0.1 mg/dL — ABNORMAL LOW (ref 0.1–0.5)
TOTAL PROTEIN: 7.3 g/dL (ref 6.5–8.1)
Total Bilirubin: 0.5 mg/dL (ref 0.3–1.2)

## 2015-03-09 LAB — I-STAT TROPONIN, ED: Troponin i, poc: 0 ng/mL (ref 0.00–0.08)

## 2015-03-09 LAB — LIPASE, BLOOD: Lipase: 25 U/L (ref 22–51)

## 2015-03-09 LAB — BASIC METABOLIC PANEL
ANION GAP: 9 (ref 5–15)
BUN: 7 mg/dL (ref 6–20)
CALCIUM: 8.8 mg/dL — AB (ref 8.9–10.3)
CHLORIDE: 102 mmol/L (ref 101–111)
CO2: 25 mmol/L (ref 22–32)
Creatinine, Ser: 1 mg/dL (ref 0.44–1.00)
GFR calc Af Amer: >60 mL/min (ref >60)
Glucose, Bld: 120 mg/dL — ABNORMAL HIGH (ref 65–99)
Potassium: 3.1 mmol/L — ABNORMAL LOW (ref 3.5–5.1)
Sodium: 136 mmol/L (ref 135–145)

## 2015-03-09 LAB — CBC
HCT: 34.5 % — ABNORMAL LOW (ref 36.0–46.0)
Hemoglobin: 10.9 g/dL — ABNORMAL LOW (ref 12.0–15.0)
MCH: 24.4 pg — ABNORMAL LOW (ref 26.0–34.0)
MCHC: 31.6 g/dL (ref 30.0–36.0)
MCV: 77.2 fL — ABNORMAL LOW (ref 78.0–100.0)
Platelets: 336 10*3/uL (ref 150–400)
RBC: 4.47 MIL/uL (ref 3.87–5.11)
RDW: 14.8 % (ref 11.5–15.5)
WBC: 7.9 10*3/uL (ref 4.0–10.5)

## 2015-03-09 LAB — D-DIMER, QUANTITATIVE (NOT AT ARMC): D DIMER QUANT: 0.42 ug{FEU}/mL (ref 0.00–0.48)

## 2015-03-09 MED ORDER — AZITHROMYCIN 250 MG PO TABS: 250.0000 mg | ORAL_TABLET | Freq: Every day | ORAL | Status: DC

## 2015-03-09 MED ORDER — KETOROLAC TROMETHAMINE 30 MG/ML IJ SOLN
15.0000 mg | Freq: Once | INTRAMUSCULAR | Status: AC
  Administered 2015-03-09: 15 mg via INTRAVENOUS
  Filled 2015-03-09: qty 1

## 2015-03-09 NOTE — Discharge Instructions (Signed)
Acute Bronchitis Bronchitis is inflammation of the airways that extend from the windpipe into the lungs (bronchi). The inflammation often causes mucus to develop. This leads to a cough, which is the most common symptom of bronchitis.  In acute bronchitis, the condition usually develops suddenly and goes away over time, usually in a couple weeks. Smoking, allergies, and asthma can make bronchitis worse. Repeated episodes of bronchitis may cause further lung problems.  CAUSES Acute bronchitis is most often caused by the same virus that causes a cold. The virus can spread from person to person (contagious) through coughing, sneezing, and touching contaminated objects. SIGNS AND SYMPTOMS   Cough.   Fever.   Coughing up mucus.   Body aches.   Chest congestion.   Chills.   Shortness of breath.   Sore throat.  DIAGNOSIS  Acute bronchitis is usually diagnosed through a physical exam. Your health care provider will also ask you questions about your medical history. Tests, such as chest X-rays, are sometimes done to rule out other conditions.  TREATMENT  Acute bronchitis usually goes away in a couple weeks. Oftentimes, no medical treatment is necessary. Medicines are sometimes given for relief of fever or cough. Antibiotic medicines are usually not needed but may be prescribed in certain situations. In some cases, an inhaler may be recommended to help reduce shortness of breath and control the cough. A cool mist vaporizer may also be used to help thin bronchial secretions and make it easier to clear the chest.  HOME CARE INSTRUCTIONS  Get plenty of rest.   Drink enough fluids to keep your urine clear or pale yellow (unless you have a medical condition that requires fluid restriction). Increasing fluids may help thin your respiratory secretions (sputum) and reduce chest congestion, and it will prevent dehydration.   Take medicines only as directed by your health care provider.  If  you were prescribed an antibiotic medicine, finish it all even if you start to feel better.  Avoid smoking and secondhand smoke. Exposure to cigarette smoke or irritating chemicals will make bronchitis worse. If you are a smoker, consider using nicotine gum or skin patches to help control withdrawal symptoms. Quitting smoking will help your lungs heal faster.   Reduce the chances of another bout of acute bronchitis by washing your hands frequently, avoiding people with cold symptoms, and trying not to touch your hands to your mouth, nose, or eyes.   Keep all follow-up visits as directed by your health care provider.  SEEK MEDICAL CARE IF: Your symptoms do not improve after 1 week of treatment.  SEEK IMMEDIATE MEDICAL CARE IF:  You develop an increased fever or chills.   You have chest pain.   You have severe shortness of breath.  You have bloody sputum.   You develop dehydration.  You faint or repeatedly feel like you are going to pass out.  You develop repeated vomiting.  You develop a severe headache. MAKE SURE YOU:   Understand these instructions.  Will watch your condition.  Will get help right away if you are not doing well or get worse. Document Released: 08/31/2004 Document Revised: 12/08/2013 Document Reviewed: 01/14/2013 [facility] Patient Information 2015 [place], [place]. This information is not intended to replace advice given to you by your health care provider. Make sure you discuss any questions you have with your health care provider.  Chest Wall Pain Chest wall pain is pain in or around the bones and muscles of your chest. It may take up to  weeks to get better. It may take longer if you must stay physically active in your work and activities.  °CAUSES  °Chest wall pain may happen on its own. However, it may be caused by: °· A viral illness like the flu. °· Injury. °· Coughing. °· Exercise. °· Arthritis. °· Fibromyalgia. °· Shingles. °HOME CARE INSTRUCTIONS   °· Avoid overtiring physical activity. Try not to strain or perform activities that cause pain. This includes any activities using your chest or your abdominal and side muscles, especially if heavy weights are used. °· Put ice on the sore area. °¨ Put ice in a plastic bag. °¨ Place a towel between your skin and the bag. °¨ Leave the ice on for 15-20 minutes per hour while awake for the first 2 days. °· Only take over-the-counter or prescription medicines for pain, discomfort, or fever as directed by your caregiver. °SEEK IMMEDIATE MEDICAL CARE IF:  °· Your pain increases, or you are very uncomfortable. °· You have a fever. °· Your chest pain becomes worse. °· You have new, unexplained symptoms. °· You have nausea or vomiting. °· You feel sweaty or lightheaded. °· You have a cough with phlegm (sputum), or you cough up blood. °MAKE SURE YOU:  °· Understand these instructions. °· Will watch your condition. °· Will get help right away if you are not doing well or get worse. °Document Released: 07/24/2005 Document Revised: 10/16/2011 Document Reviewed: 03/20/2011 °[employer]® Patient Information ©2015 [employer]. This information is not intended to replace advice given to you by your health care provider. Make sure you discuss any questions you have with your health care provider. ° °

## 2015-03-09 NOTE — ED Notes (Signed)
Pt c/o central chest pain that she verbalizes is typical of her several other visits for chest wall pain.

## 2015-03-09 NOTE — ED Notes (Signed)
Pt. reports pain across her chest onset this evening with SOB , productive cough and nausea .

## 2015-03-09 NOTE — ED Notes (Signed)
Pt returned from xray

## 2015-03-09 NOTE — ED Provider Notes (Signed)
CSN: [id number]     Arrival date & time 03/09/15  0223 History   This chart was scribed for Tilden Fossa, MD by Evon Slack, ED Scribe. This patient was seen in room A01C/A01C and the patient's care was started at 3:17 AM.     Chief Complaint  Patient presents with  . Chest Pain   Patient is a 34 y.o. female presenting with chest pain. The history is provided by the patient. No language interpreter was used.  Chest Pain Pain location:  Substernal area Pain radiates to:  Does not radiate Pain radiates to the back: no   Pain severity:  Mild Onset quality:  Gradual Duration:  22 days Progression:  Worsening Chronicity:  New Relieved by:  Certain positions Worsened by:  Certain positions Associated symptoms: abdominal pain, cough and shortness of breath   Associated symptoms: no fever, no lower extremity edema, no nausea and not vomiting    HPI Comments: Erica [name] is a 34 y.o. female who presents to the Emergency Department complaining of worseing CP onset 2 days prior. Pt describes the pain as needles poking her in her chest. She states she has associated cough, SOB, slight abdominal pain. Pt states that laying on her left side the pain improves. She states that the pain is worse when laying on her stomach or back. Pt denies recent surgery or travel. Pt denies recent injury or trauma to the chest. Denies leg swelling, fever,nausea or vomiting.   Past Medical History  Diagnosis Date  . No pertinent past medical history   . Abnormal Pap smear   . Bronchitis    Past Surgical History  Procedure Laterality Date  . No past surgeries     Family History  Problem Relation Age of Onset  . Arthritis Mother   . Hypertension Mother   . Diabetes Mother   . Asthma Mother   . Arthritis Father   . Hypertension Father   . Diabetes Father    History  Substance Use Topics  . Smoking status: Former Smoker -- 0.00 packs/day for .5 years  . Smokeless tobacco: Never Used  . Alcohol  Use: Yes     Comment: Occassionally.    OB History    Gravida Para Term Preterm AB TAB SAB Ectopic Multiple Living   3 2 2  1  1   2       Review of Systems  Constitutional: Negative for fever.  Respiratory: Positive for cough and shortness of breath.   Cardiovascular: Positive for chest pain.  Gastrointestinal: Positive for abdominal pain. Negative for nausea and vomiting.  All other systems reviewed and are negative.    Allergies  Review of patient's allergies indicates no known allergies.  Home Medications   Prior to Admission medications   Medication Sig Start Date End Date Taking? Authorizing Provider  ibuprofen (ADVIL,MOTRIN) 800 MG tablet Take 1 tablet (800 mg total) by mouth every 8 (eight) hours as needed for mild pain. 02/22/15   Kristen N Ward, DO   BP 106/75 mmHg  Pulse 69  Temp(Src) 98.3 F (36.8 C) (Oral)  Resp 19  SpO2 98%  LMP 03/07/2015   Physical Exam  Constitutional: She is oriented to person, place, and time. She appears well-developed and well-nourished.  HENT:  Head: Normocephalic and atraumatic.  Cardiovascular: Normal rate and regular rhythm.   No murmur heard. Pulmonary/Chest: Effort normal and breath sounds normal. No respiratory distress. She exhibits tenderness.  Abdominal: Soft. There is tenderness (diffuse/mild). There is  no rebound and no guarding.  Musculoskeletal: She exhibits no edema or tenderness.  Neurological: She is alert and oriented to person, place, and time.  Skin: Skin is warm and dry.  Psychiatric: She has a normal mood and affect. Her behavior is normal.  Nursing note and vitals reviewed.   ED Course  Procedures (including critical care time) DIAGNOSTIC STUDIES: Oxygen Saturation is 98% on RA, normal by my interpretation.    COORDINATION OF CARE: 3:18 AM-Discussed treatment plan with pt at bedside and pt agreed to plan.     Labs Review Labs Reviewed  CBC - Abnormal; Notable for the following:    Hemoglobin 10.9  (*)    HCT 34.5 (*)    MCV 77.2 (*)    MCH 24.4 (*)    All other components within normal limits  BASIC METABOLIC PANEL  I-STAT TROPOININ, ED    Imaging Review Dg Chest 2 View  03/09/2015   CLINICAL DATA:  Mid chest pain tonight.  EXAM: CHEST  2 VIEW  COMPARISON:  Chest radiograph February 22, 2015  FINDINGS: Cardiomediastinal silhouette is unremarkable. The lungs are clear without pleural effusions or focal consolidations. Trachea projects midline and there is no pneumothorax. Soft tissue planes and included osseous structures are non-suspicious.  IMPRESSION: Normal chest.   Electronically Signed   By: Awilda Metro M.D.   On: 03/09/2015 03:07     EKG Interpretation   Date/Time:  Tuesday March 09 2015 02:25:43 EDT Ventricular Rate:  80 PR Interval:  138 QRS Duration: 76 QT Interval:  356 QTC Calculation: 410 R Axis:   65 Text Interpretation:  Normal sinus rhythm Nonspecific T wave abnormality  Abnormal ECG Confirmed by Lincoln Brigham [phone]) on 03/09/2015 2:44:13 AM      MDM   Final diagnoses:  Acute bronchitis, unspecified organism  Chest wall pain   Patient here for evaluation of chest pain, small volume hemoptysis. Chest pain is reproducible on exam. History and presentation is not consistent with dissection. Patient is low risk for PE, d-dimer negative. Discussed with patient findings of bronchitis, treated with Z-Pak. Discussed return precautions.    I personally performed the services described in this documentation, which was scribed in my presence. The recorded information has been reviewed and is accurate.      Tilden Fossa, MD 03/09/15 [id number]

## 2015-03-09 NOTE — ED Notes (Signed)
Patient transported to X-ray 

## 2015-03-09 NOTE — ED Notes (Signed)
Patient sleeping soundly when RN entered room. No distress noted.

## 2015-04-11 ENCOUNTER — Encounter (HOSPITAL_COMMUNITY): Payer: Self-pay | Admitting: *Deleted

## 2015-04-11 DIAGNOSIS — Y9289 Other specified places as the place of occurrence of the external cause: Secondary | ICD-10-CM | POA: Diagnosis not present

## 2015-04-11 DIAGNOSIS — S4992XA Unspecified injury of left shoulder and upper arm, initial encounter: Secondary | ICD-10-CM | POA: Diagnosis not present

## 2015-04-11 DIAGNOSIS — S0993XA Unspecified injury of face, initial encounter: Secondary | ICD-10-CM | POA: Diagnosis not present

## 2015-04-11 DIAGNOSIS — Y999 Unspecified external cause status: Secondary | ICD-10-CM | POA: Insufficient documentation

## 2015-04-11 DIAGNOSIS — Y9389 Activity, other specified: Secondary | ICD-10-CM | POA: Diagnosis not present
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
## 2015-04-12 ENCOUNTER — Emergency Department (HOSPITAL_COMMUNITY)
Admission: EM | Admit: 2015-04-12 | Discharge: 2015-04-12 | Disposition: A | Discharge status: Home / Self Care | Payer: No Typology Code available for payment source | Attending: Emergency Medicine | Admitting: Emergency Medicine

## 2015-04-12 ENCOUNTER — Emergency Department (HOSPITAL_COMMUNITY)
Admission: EM | Admit: 2015-04-12 | Discharge: 2015-04-12 | Discharge status: Against Medical Advice | Payer: Medicaid Other | Attending: Emergency Medicine | Admitting: Emergency Medicine

## 2015-04-12 ENCOUNTER — Encounter (HOSPITAL_COMMUNITY): Payer: Self-pay | Admitting: *Deleted

## 2015-04-12 ENCOUNTER — Emergency Department (HOSPITAL_COMMUNITY): Payer: No Typology Code available for payment source

## 2015-04-12 DIAGNOSIS — S20219A Contusion of unspecified front wall of thorax, initial encounter: Secondary | ICD-10-CM | POA: Insufficient documentation

## 2015-04-12 DIAGNOSIS — S40012A Contusion of left shoulder, initial encounter: Secondary | ICD-10-CM | POA: Insufficient documentation

## 2015-04-12 DIAGNOSIS — Y9289 Other specified places as the place of occurrence of the external cause: Secondary | ICD-10-CM | POA: Insufficient documentation

## 2015-04-12 DIAGNOSIS — Y998 Other external cause status: Secondary | ICD-10-CM | POA: Insufficient documentation

## 2015-04-12 DIAGNOSIS — Z8709 Personal history of other diseases of the respiratory system: Secondary | ICD-10-CM | POA: Insufficient documentation

## 2015-04-12 DIAGNOSIS — S1093XA Contusion of unspecified part of neck, initial encounter: Secondary | ICD-10-CM | POA: Insufficient documentation

## 2015-04-12 DIAGNOSIS — Y9389 Activity, other specified: Secondary | ICD-10-CM | POA: Insufficient documentation

## 2015-04-12 DIAGNOSIS — Z87891 Personal history of nicotine dependence: Secondary | ICD-10-CM | POA: Insufficient documentation

## 2015-04-12 MED ORDER — IBUPROFEN 800 MG PO TABS: 800.0000 mg | ORAL_TABLET | Freq: Three times a day (TID) | ORAL | Status: DC

## 2015-04-12 MED ORDER — METHOCARBAMOL 500 MG PO TABS: 500.0000 mg | ORAL_TABLET | Freq: Two times a day (BID) | ORAL | Status: DC

## 2015-04-12 NOTE — Discharge Instructions (Signed)
Chest Contusion A chest contusion is a deep bruise on your chest area. Contusions are the result of an injury that caused bleeding under the skin. A chest contusion may involve bruising of the skin, muscles, or ribs. The contusion may turn blue, purple, or yellow. Minor injuries will give you a painless contusion, but more severe contusions may stay painful and swollen for a few weeks. CAUSES  A contusion is usually caused by a blow, trauma, or direct force to an area of the body. SYMPTOMS   Swelling and redness of the injured area.  Discoloration of the injured area.  Tenderness and soreness of the injured area.  Pain. DIAGNOSIS  The diagnosis can be made by taking a history and performing a physical exam. An X-ray, CT scan, or MRI may be needed to determine if there were any associated injuries, such as broken bones (fractures) or internal injuries. TREATMENT  Often, the best treatment for a chest contusion is resting, icing, and applying cold compresses to the injured area. Deep breathing exercises may be recommended to reduce the risk of pneumonia. Over-the-counter medicines may also be recommended for pain control. HOME CARE INSTRUCTIONS   Put ice on the injured area.  Put ice in a plastic bag.  Place a towel between your skin and the bag.  Leave the ice on for 15-20 minutes, 03-04 times a day.  Only take over-the-counter or prescription medicines as directed by your caregiver. Your caregiver may recommend avoiding anti-inflammatory medicines (aspirin, ibuprofen, and naproxen) for 48 hours because these medicines may increase bruising.  Rest the injured area.  Perform deep-breathing exercises as directed by your caregiver.  Stop smoking if you smoke.  Do not lift objects over 5 pounds (2.3 kg) for 3 days or longer if recommended by your caregiver. SEEK IMMEDIATE MEDICAL CARE IF:  1. You have increased bruising or swelling. 2. You have pain that is getting worse. 3. You  have difficulty breathing. 4. You have dizziness, weakness, or fainting. 5. You have blood in your urine or stool. 6. You cough up or vomit blood. 7. Your swelling or pain is not relieved with medicines. MAKE SURE YOU:  1. Understand these instructions. 2. Will watch your condition. 3. Will get help right away if you are not doing well or get worse. Document Released: 04/18/2001 Document Revised: 04/17/2012 Document Reviewed: 01/15/2012 [facility] Patient Information 2015 [place], [place]. This information is not intended to replace advice given to you by your health care provider. Make sure you discuss any questions you have with your health care provider. Soft Tissue Injury of the Neck  A soft tissue injury of the neck needs medical care right away. These injuries are often caused by a direct hit to the neck. Some injuries do not break the skin (blunt injury). Some injuries do break the skin (penetrating injury) and create an open wound. You may feel fine at first, but the puffiness (swelling) in your throat can slowly make it harder to breathe. This could cause serious or life-threatening injury. There could be damage to major blood vessels and nerves in the neck. Neck injuries need to be checked by a doctor. HOME CARE If the skin was broken, keep the area clean and dry. Care for your wound as told by your doctor. Follow your doctor's diet advice. Follow your doctor's advice about using your voice. Only take medicines as told by your doctor. Keep your head and neck raised (elevated). Do this while you sleep, too. GET HELP  RIGHT AWAY IF: Your voice gets weaker. Your puffiness or bruising does not get better. You have problems with your medicines. You see fluid coming from the wound. Your pain gets worse, or you have trouble swallowing. You cough up blood. You have trouble breathing. You start to drool. You start throwing up (vomiting). You have new puffiness in the neck or face. You have  a temperature by mouth above 102 F (38.9 C), not controlled by medicine. MAKE SURE YOU: Understand these instructions. Will watch your condition. Will get help right away if you are not doing well or get worse. Document Released: 11/03/2010 Document Revised: 10/16/2011 Document Reviewed: 11/03/2010 [facility] Patient Information 2015 [place], [place]. This information is not intended to replace advice given to you by your health care provider. Make sure you discuss any questions you have with your health care provider.  Sexual Assault, Rape  Sexual assault can be physical, verbal, visual, or anything that forces a person to have unwanted sexual contact. You are being sexually abused if you are forced to have sexual contact of any kind (vaginal, oral, or anal). Sexual assault is called rape if penetration has occurred. Sexual assault and rape are never the victim's fault.  The physical dangers of rape include pregnancy, injury, and catching a sexually transmitted infection (STI). Your caregiver or emergency room doctor may recommend a number of tests to be done after a sexual assault or rape. A rape kit will collect evidence and check for infection and injury. You may be treated for an infection even if no signs of one are present. This may also be true if tests and cultures for disease are negative. Emergency contraceptive medications are also available to help prevent pregnancy, if this is desired. All of these options can be discussed with your caregiver. A sexual assault is a traumatic event. Counseling is available.  STEPS TO TAKE IF A SEXUAL ASSAULT HAS HAPPENED  Go to an area of safety as quickly as possible and call the police. This may include going to a shelter or staying with a friend. Stay away from the area where you have been attacked. Many times, sexual assaults are caused by a friend, relative, or associate.  Do not wash, shower, comb your hair, or clean any part of your body.  Do not  change your clothes.  Do not remove or touch anything in the area where you were assaulted.  Go to an emergency room or your caregiver for a complete physical exam. Get the necessary tests to protect yourself from disease or pregnancy.  If medications were given by your caregiver, take them as directed for the full length of time prescribed. If you have come in contact with a sexual infection, find out if you are to be tested again. If your caregiver is concerned about the HIV/AIDS virus, you may be required to have continued testing for several months. Make sure you know how to get test results. It is your responsibility to get the results of all testing done.  File the correct papers with the authorities. This is important for all assaults, even if they were done by a family member orfriend.  Only take over-the-counter or prescription medicines for pain, discomfort, or fever as directed by your caregiver. HOME CARE INSTRUCTIONS  Carry mace or pepper spray for protection against an attacker.  Do not try to fight off an attacker if he or she has a gun or knife.  Be aware of your surroundings, what is happening  around you, and who might be there.  Be assertive, trust your instincts, and walk with confidence and direction.  Always lock your doors and windows.  Do not let anyone who you do not know enter your house.  Get door safety restraints and always use them.  Get a security system that has a siren if you are able.  Protect your house and car keys. Do not lend them out. Do not put your name and address on them. If you lose them, get your locks changed.  Always lock your car and have your key ready to open the door before approaching the car.  Park in a well lit and busy area.  Keep your car serviced. Always have at least half of a tank of gas in it.   POSSIBLE TREATMENT:   You may have received oral or injectable antibiotics to help prevent sexually transmitted infections.  Hepatitis B  vaccine- Immunization should be given if not already immunized.  This is a series of three injections, so any further injections should be obtained by your primary care physician.  HPV (Human Papilloma Virus) vaccine (Gardisil)- Immunization should be given, if not immunized previously or not up-to-date.  HIV (Human Immunodeficiency Virus)- Your caregiver will help you decide whether to begin the prophylactic medications, based on your circumstances.    Tetanus Immunization- This also may be recommended based on your circumstances.  Pregnancy Prevention-  Also called "emergency contraception."  This will be offered to you if the situation indicates.  SUGGESTED FOLLOW-UP CARE:  Please follow-up with your medical care provider or where you/your child were referred (health department, women's clinic, pediatrician, etc) IN TEN DAYS TO TWO WEEKS.  We recommend the following during that visit, if indicated:  Pregnancy test  HIV, syphyllis, and Hepatitis blood testing  Cultures for sexually transmitted infections   Drive on lighted and frequently traveled streets.  Do not go into isolated areas like open garages, empty offices, buildings, or laundry rooms alone.  Do not walk or jog alone, especially when it is dark.  Never hitchhike!  If your car breaks down, call the police for help on your cell phone, put the hood of your car up, and a put a "HELP" sign on your front and back windows.  If you are being followed, go to a busy store or to someone's house and call for help.  If you are stopped by a Engineer, structural, especially in an unmarked police car, keep your door locked. Do not put your window down all the way. Ask them to show you identification first.  Beware of "date rape drugs" that can be placed in a drink when you are not looking and can make you unable to fight off an assault. They usually cause memory loss. If you know someone who has been sexually abused or raped, take them to a  hospital or to the police or call your local emergency services for help.  SEEK MEDICAL CARE IF:  You have new problems because of your injuries.  You have problems that may be because of the medicine you are taking. These problems may include rash, itching, swelling, or trouble breathing.  You develop belly (abdominal) pain, you feel sick to your stomach (nausea), or you vomit.  You have an oral temperature above 102 F (38.9 C).  You need supportive care or referral to a rape crisis center. These are centers with trained personnel who can help you get through this ordeal.  You have  abnormal vaginal bleeding.  You have abnormal vaginal discharge. SEEK IMMEDIATE MEDICAL CARE IF:  You have been sexually assaulted or raped. Call your local emergency department (911 in the [place] for help.  You are afraid of being threatened, beaten, or abused. Call your local emergency department (911 in the [place] for help.  You receive new injuries related to abuse.  You have an oral temperature above 102 F (38.9 C), not controlled by medicine. For more information on sexual assault and rape call the [place] at [phone].  Document Released: 07/21/2000 Document Revised: 10/16/2011 Document Reviewed: 09/03/2008  [facility] Patient Information 2014 [place], [place].   Sexual Assault is an unwanted sexual act or contact made against you by another person.  You may not agree to the contact, or you may agree to it because you are pressured, forced, or threatened.  You may have agreed to it when you could not think clearly, such as after drinking alcohol or using drugs.  Sexual assault can include unwanted touching of your genital areas (vagina or penis), assault by penetration (when an object is forced into the vagina or anus), or rape.  Rape is when the vagina is penetrated (be it ever so slight) by the penis.  Sexual assault can be perpetrated (committed) by strangers, friends,  and even family members.  However, most sexual assaults are committed by someone that is known to the victim.  Sexual assault is not your fault!  The attacker is always at fault! A sexual assault is a traumatic event, which can lead to physical, emotional, and psychological damage.  The physical dangers of sexual assault can include the possibility of acquiring Sexually Transmitted Infections (STIs), the risk of an unwanted pregnancy, and/or physical trauma/injuries.  The Office manager (FNE) or your caregiver may recommend prophylactic (preventative) treatment for Sexually Transmitted Infections, even if you have not been tested and even if no signs of an infection are present at the time you are evaluated.  Emergency Contraceptive Medications are also available to decrease your chances of becoming pregnant from the assault, if you desire.  The FNE or caregiver will discuss the options for treatment with you, as well as opportunities for referrals for counseling and other services are available if you are interested. SPECIAL INSTRUCTIONS:  Although you may not wish to speak to a crisis advocate at this time, they are available to you 24 hours a day/7 days a week via telephone should you wish to speak with someone after your visit. o In [facility], you may contact [facility] at [phone] [facility] [phone] o In [place], you may contact [facility] [employer]: [place]  [phone]    Follow up with an OB/GYN (or your primary physician) within 10-14 days post assault.  Please take this packet with you when you visit the practitioner.  If you do not have an OB/GYN, the FNE can refer you to an OB/GYN within the [place] or you can follow up with the [place] via telephone at [phone], the [place] at [phone] or the [facility]  Department within your community.    Post exposure testing for Sexually Transmitted Infections, including Human Immunodeficiency Virus (HIV) and Hepatitis, is recommended 10-14 days following your examination by your OB/GYN or primary physician. Routine testing for Sexually Transmitted Infections was not done for infections during your visit.  You  were given prophylactic medications to prevent infection from your attacker.  Follow up is recommended to ensure that it was effective.  If medications were given to you by the FNE or your caregiver, take them as directed.  Tell your primary healthcare provider or the OB/GYN if you think your medicine is not helping or if you have side effects.  Also tell the healthcare provider if you are taking any vitamins, herbs, or other medications.  Keep a list of the medicines you take.  Include the amount(s), the reason(s) why, and the frequency at which you take the medicine(s). HOME CARE INSTRUCTIONS: Medications:  Antibiotics:  You may have been given antibiotics to prevent STIs.  These germ-killing medicines can help prevent Gonorrhea, Chlamydia, & Syphilis.  Always take your antibiotics exactly as directed by the FNE or caregiver.  Keep taking the antibiotics until they are completely gone.  Emergency Contraceptive Medication:  You may have been given hormone (progesterone) medication to decrease the likelihood of becoming pregnant after the assault.  The indication for taking this medication is to help prevent pregnancy after unprotected sex or after failure of another birth control method.  The success of the medication can be rated as high as 89% effective against unwanted pregnancy, when the medication is taken within seventy-two hours after sexual intercourse.  This is NOT an abortion pill.  HIV Prophylactics: You may also have been given medication to help prevent HIV.  If so, these medicines should be taken from for a full 28 days and it is important you  not miss any doses. In addition, you will need to be followed by a physician specializing in Infectious Diseases to monitor your course of treatment. SEEK MEDICAL CARE FROM YOUR HEALTH CARE PROVIDER, AN URGENT CARE FACILITY, OR THE CLOSEST HOSPITAL IF:    You have problems that may be because of the medicine(s) you are taking.  These problems could include:  trouble breathing, swelling, itching, and/or you experience a rash.  You have fatigue, a sore throat, and/or swollen lymph nodes (glands in your neck).  You are taking medicines and cannot stop vomiting.  You feel very sad and think you cannot cope with what has happened to you.  You have a fever.  You have pain in your abdomen (belly) or pelvic pain.  You have abnormal vaginal/rectal bleeding.  You have abnormal vaginal discharge (fluid) that is different from usual.  You have new problems because of your injuries.    You think you are pregnant.  FOLLOW-UP CARE:  Wherever you receive your follow-up treatment, the caregiver should re-check your injuries (if there were any present), evaluate whether you are taking the medicines as prescribed, and determine if you are experiencing any side effects from the medication(s).  You may also need the following, additional testing at your follow-up visit:  Pregnancy testing:  Women of childbearing age may need follow-up pregnancy testing.  You may also need testing if you do not have a period (menstruation) within 28 days of the assault.  HIV & Syphilis testing:  If you were/were not tested for HIV and/or Syphilis during your initial exam, you will need follow-up testing.  This testing should occur 6 weeks after the assault.  You should also have follow-up testing for HIV at 3 months, 6 months, and 1 year intervals following the assault.    Hepatitis B Vaccine:  If you received the first dose of the Hepatitis B Vaccine during your initial examination, then you will need  an additional 2  follow-up doses to ensure your immunity.  The second dose should be administered 1 to 2 months after the first dose.  The third dose should be administered 4 to 6 months after the first dose.  You will need all three doses for the vaccine to be effective and to keep you immune from acquiring Hepatitis B. COMMON FEELINGS AFTER SEXUAL ASSAULT:  People have different reactions after they have been sexually assaulted.  You may feel powerless.  You may feel anxious, afraid, or angry.  You may also feel disbelief, shame, or even guilt.  You may experience a loss of trust in others and wish to avoid people.  You may lose interest in sex.  You may have concerns about how your family or friends will react after the assault.  It is common for your feelings to change soon after the assault.  You may feel calm at first and then be upset later.  Trouble coping after a sexual assault can lead to long-term physical, emotional, and/or psychological problems.  You may experience sleep and eating disturbances, and you may abuse substances.  Depression (deep sadness) can result.  You may suffer from Post-Traumatic Stress Disorder (PTSD) after a sexual assault.  This is an anxiety disorder that occurs after a very stressful event.  It may be accompanied by nightmares and/or flashbacks.  You may even have thoughts of suicide.  Talk to your caregiver if any of these problems occur. FOR MORE INFORMATION AND SUPPORT:  It may take a long time to recover after you have been sexually assaulted.  Specially trained caregivers can help you recover.  Therapy can help you become aware of how you see things and can help you think in a more positive way.  Caregivers may teach you new or different ways to manage your anxiety and stress.  Family meetings can help you and your family, or those close to you, learn to cope with the sexual assault.  You may want to join a support group with those who have been sexually assaulted.  Your local  crisis center can help you find the services you need.  You also can contact the following organizations for additional information: o Rape, [place]) - 1-800-656-HOPE [phone]) or http://www.rainn.[place]     [phone] or [email or web address]://torres-moran.[email or web address]   Assault, General Assault includes any behavior, whether intentional or reckless, which results in bodily injury to another person and/or damage to property. Included in this would be any behavior, intentional or reckless, that by its nature would be understood (interpreted) by a reasonable person as intent to harm another person or to damage his/her property. Threats may be oral or written. They may be communicated through regular mail, computer, fax, or phone. These threats may be direct or implied. FORMS OF ASSAULT INCLUDE: Physically assaulting a person. This includes physical threats to inflict physical harm as well as: Slapping. Hitting. Poking. Kicking. Punching. Pushing. Arson. Sabotage. Equipment vandalism. Damaging or destroying property. Throwing or hitting objects. Displaying a weapon or an object that appears to be a weapon in a threatening manner. Carrying a firearm of any kind. Using a weapon to harm someone. Using greater physical size/strength to intimidate another. Making intimidating or threatening gestures. Bullying. Hazing. Intimidating, threatening, hostile, or abusive language directed toward another person. It communicates the intention to engage in violence against that person. And it leads a reasonable person to expect that violent behavior  may occur. Stalking another person. IF IT HAPPENS AGAIN: Immediately call for emergency help (911 in [place] If someone poses clear and immediate danger to you, seek legal authorities to have a protective or restraining order put in place. Less threatening assaults can at least be reported to  authorities. STEPS TO TAKE IF A SEXUAL ASSAULT HAS HAPPENED Go to an area of safety. This may include a shelter or staying with a friend. Stay away from the area where you have been attacked. A large percentage of sexual assaults are caused by a friend, relative or associate. If medications were given by your caregiver, take them as directed for the full length of time prescribed. Only take over-the-counter or prescription medicines for pain, discomfort, or fever as directed by your caregiver. If you have come in contact with a sexual disease, find out if you are to be tested again. If your caregiver is concerned about the HIV/AIDS virus, he/she may require you to have continued testing for several months. For the protection of your privacy, test results can not be given over the phone. Make sure you receive the results of your test. If your test results are not back during your visit, make an appointment with your caregiver to find out the results. Do not assume everything is normal if you have not heard from your caregiver or the medical facility. It is important for you to follow up on all of your test results. File appropriate papers with authorities. This is important in all assaults, even if it has occurred in a family or by a friend. SEEK MEDICAL CARE IF: You have new problems because of your injuries. You have problems that may be because of the medicine you are taking, such as: Rash. Itching. Swelling. Trouble breathing. You develop belly (abdominal) pain, feel sick to your stomach (nausea) or are vomiting. You begin to run a temperature. You need supportive care or referral to a rape crisis center. These are centers with trained personnel who can help you get through this ordeal. SEEK IMMEDIATE MEDICAL CARE IF: You are afraid of being threatened, beaten, or abused. In [place] call 911. You receive new injuries related to abuse. You develop severe pain in any area injured in the assault  or have any change in your condition that concerns you. You faint or lose consciousness. You develop chest pain or shortness of breath. Document Released: 07/24/2005 Document Revised: 10/16/2011 Document Reviewed: 03/11/2008 [facility] Patient Information 2015 [place], [place]. This information is not intended to replace advice given to you by your health care provider. Make sure you discuss any questions you have with your health care provider. -
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
## 2015-04-12 NOTE — ED Notes (Signed)
Called x 2. No answer 
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
## 2015-04-12 NOTE — ED Notes (Signed)
Discharge information discussed with patient, no further questions or needs. Departed ambulatory with SANE nurse.
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
## 2015-04-12 NOTE — ED Notes (Signed)
Per SANE nurse please call them back once the MD has medically cleared pt. To ensure that pt is stable and can be taken to another room for SANE exam.
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
## 2015-04-14 ENCOUNTER — Emergency Department (HOSPITAL_COMMUNITY)
Admission: EM | Admit: 2015-04-14 | Discharge: 2015-04-14 | Disposition: A | Discharge status: Home / Self Care | Payer: Medicaid Other | Attending: Emergency Medicine | Admitting: Emergency Medicine

## 2015-04-14 ENCOUNTER — Encounter (HOSPITAL_COMMUNITY): Payer: Self-pay | Admitting: Emergency Medicine

## 2015-04-14 DIAGNOSIS — R0789 Other chest pain: Secondary | ICD-10-CM

## 2015-04-14 DIAGNOSIS — M25512 Pain in left shoulder: Secondary | ICD-10-CM | POA: Insufficient documentation

## 2015-04-14 DIAGNOSIS — Z87891 Personal history of nicotine dependence: Secondary | ICD-10-CM | POA: Diagnosis not present

## 2015-04-14 DIAGNOSIS — Z8709 Personal history of other diseases of the respiratory system: Secondary | ICD-10-CM | POA: Diagnosis not present

## 2015-04-14 DIAGNOSIS — M79602 Pain in left arm: Secondary | ICD-10-CM | POA: Insufficient documentation

## 2015-04-14 MED ORDER — OXYCODONE-ACETAMINOPHEN 5-325 MG PO TABS
1.0000 | ORAL_TABLET | Freq: Once | ORAL | Status: AC
  Administered 2015-04-14: 1 via ORAL
  Filled 2015-04-14: qty 1
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
## 2015-04-14 NOTE — Discharge Instructions (Signed)
Shoulder Sprain °A shoulder sprain is the result of damage to the tough, fiber-like tissues (ligaments) that help hold your shoulder in place. The ligaments may be stretched or torn. Besides the main shoulder joint (the ball and socket), there are several smaller joints that connect the bones in this area. A sprain usually involves one of those joints. Most often it is the acromioclavicular (or AC) joint. That is the joint that connects the collarbone (clavicle) and the shoulder blade (scapula) at the top point of the shoulder blade (acromion). °A shoulder sprain is a mild form of what is called a shoulder separation. Recovering from a shoulder sprain may take some time. For some, pain lingers for several months. Most people recover without long term problems. °CAUSES  °· A shoulder sprain is usually caused by some kind of trauma. This might be: °¨ Falling on an outstretched arm. °¨ Being hit hard on the shoulder. °¨ Twisting the arm. °· Shoulder sprains are more likely to occur in people who: °¨ Play sports. °¨ Have balance or coordination problems. °SYMPTOMS  °· Pain when you move your shoulder. °· Limited ability to move the shoulder. °· Swelling and tenderness on top of the shoulder. °· Redness or warmth in the shoulder. °· Bruising. °· A change in the shape of the shoulder. °DIAGNOSIS  °Your healthcare provider may: °· Ask about your symptoms. °· Ask about recent activity that might have caused those symptoms. °· Examine your shoulder. You may be asked to do simple exercises to test movement. The other shoulder will be examined for comparison. °· Order some tests that provide a look inside the body. They can show the extent of the injury. The tests could include: °¨ X-rays. °¨ CT (computed tomography) scan. °¨ MRI (magnetic resonance imaging) scan. °RISKS AND COMPLICATIONS °· Loss of full shoulder motion. °· Ongoing shoulder pain. °TREATMENT  °How long it takes to recover from a shoulder sprain depends on how  severe it was. Treatment options may include: °· Rest. You should not use the arm or shoulder until it heals. °· Ice. For 2 or 3 days after the injury, put an ice pack on the shoulder up to 4 times a day. It should stay on for 15 to 20 minutes each time. Wrap the ice in a towel so it does not touch your skin. °· Over-the-counter medicine to relieve pain. °· A sling or brace. This will keep the arm still while the shoulder is healing. °· Physical therapy or rehabilitation exercises. These will help you regain strength and motion. Ask your healthcare provider when it is OK to begin these exercises. °· Surgery. The need for surgery is rare with a sprained shoulder, but some people may need surgery to keep the joint in place and reduce pain. °HOME CARE INSTRUCTIONS  °· Ask your healthcare provider about what you should and should not do while your shoulder heals. °· Make sure you know how to apply ice to the correct area of your shoulder. °· Talk with your healthcare provider about which medications should be used for pain and swelling. °· If rehabilitation therapy will be needed, ask your healthcare provider to refer you to a therapist. If it is not recommended, then ask about at-home exercises. Find out when exercise should begin. °SEEK MEDICAL CARE IF:  °Your pain, swelling, or redness at the joint increases. °SEEK IMMEDIATE MEDICAL CARE IF:  °· You have a fever. °· You cannot move your arm or shoulder. °Document Released: 12/10/2008 Document   Revised: 10/16/2011 Document Reviewed: 12/10/2008 [employer] Patient Information 2015 [place], [place]. This information is not intended to replace advice given to you by your health care provider. Make sure you discuss any questions you have with your health care provider. Chest Wall Pain Chest wall pain is pain in or around the bones and muscles of your chest. It may take up to 6 weeks to get better. It may take longer if you must stay physically active in your work and  activities.  CAUSES  Chest wall pain may happen on its own. However, it may be caused by:  A viral illness like the flu.  Injury.  Coughing.  Exercise.  Arthritis.  Fibromyalgia.  Shingles. HOME CARE INSTRUCTIONS   Avoid overtiring physical activity. Try not to strain or perform activities that cause pain. This includes any activities using your chest or your abdominal and side muscles, especially if heavy weights are used.  Put ice on the sore area.  Put ice in a plastic bag.  Place a towel between your skin and the bag.  Leave the ice on for 15-20 minutes per hour while awake for the first 2 days.  Only take over-the-counter or prescription medicines for pain, discomfort, or fever as directed by your caregiver. SEEK IMMEDIATE MEDICAL CARE IF:   Your pain increases, or you are very uncomfortable.  You have a fever.  Your chest pain becomes worse.  You have new, unexplained symptoms.  You have nausea or vomiting.  You feel sweaty or lightheaded.  You have a cough with phlegm (sputum), or you cough up blood. MAKE SURE YOU:   Understand these instructions.  Will watch your condition.  Will get help right away if you are not doing well or get worse. Document Released: 07/24/2005 Document Revised: 10/16/2011 Document Reviewed: 03/20/2011 [name] Patient Information 2015 [place], [place]. This information is not intended to replace advice given to you by your health care provider. Make sure you discuss any questions you have with your health care provider. Cryotherapy Cryotherapy means treatment with cold. Ice or gel packs can be used to reduce both pain and swelling. Ice is the most helpful within the first 24 to 48 hours after an injury or flare-up from overusing a muscle or joint. Sprains, strains, spasms, burning pain, shooting pain, and aches can all be eased with ice. Ice can also be used when recovering from surgery. Ice is effective, has very few side  effects, and is safe for most people to use. PRECAUTIONS  Ice is not a safe treatment option for people with:  Raynaud phenomenon. This is a condition affecting small blood vessels in the extremities. Exposure to cold may cause your problems to return.  Cold hypersensitivity. There are many forms of cold hypersensitivity, including:  Cold urticaria. Red, itchy hives appear on the skin when the tissues begin to warm after being iced.  Cold erythema. This is a red, itchy rash caused by exposure to cold.  Cold hemoglobinuria. Red blood cells break down when the tissues begin to warm after being iced. The hemoglobin that carry oxygen are passed into the urine because they cannot combine with blood proteins fast enough.  Numbness or altered sensitivity in the area being iced. If you have any of the following conditions, do not use ice until you have discussed cryotherapy with your caregiver:  Heart conditions, such as arrhythmia, angina, or chronic heart disease.  High blood pressure.  Healing wounds or open skin in the area being iced.  Current infections.  Rheumatoid arthritis.  Poor circulation.  Diabetes. Ice slows the blood flow in the region it is applied. This is beneficial when trying to stop inflamed tissues from spreading irritating chemicals to surrounding tissues. However, if you expose your skin to cold temperatures for too long or without the proper protection, you can damage your skin or nerves. Watch for signs of skin damage due to cold. HOME CARE INSTRUCTIONS Follow these tips to use ice and cold packs safely.  Place a dry or damp towel between the ice and skin. A damp towel will cool the skin more quickly, so you may need to shorten the time that the ice is used.  For a more rapid response, add gentle compression to the ice.  Ice for no more than 10 to 20 minutes at a time. The bonier the area you are icing, the less time it will take to get the benefits of  ice.  Check your skin after 5 minutes to make sure there are no signs of a poor response to cold or skin damage.  Rest 20 minutes or more between uses.  Once your skin is numb, you can end your treatment. You can test numbness by very lightly touching your skin. The touch should be so light that you do not see the skin dimple from the pressure of your fingertip. When using ice, most people will feel these normal sensations in this order: cold, burning, aching, and numbness.  Do not use ice on someone who cannot communicate their responses to pain, such as small children or people with dementia. HOW TO MAKE AN ICE PACK Ice packs are the most common way to use ice therapy. Other methods include ice massage, ice baths, and cryosprays. Muscle creams that cause a cold, tingly feeling do not offer the same benefits that ice offers and should not be used as a substitute unless recommended by your caregiver. To make an ice pack, do one of the following:  Place crushed ice or a bag of frozen vegetables in a sealable plastic bag. Squeeze out the excess air. Place this bag inside another plastic bag. Slide the bag into a pillowcase or place a damp towel between your skin and the bag.  Mix 3 parts water with 1 part rubbing alcohol. Freeze the mixture in a sealable plastic bag. When you remove the mixture from the freezer, it will be slushy. Squeeze out the excess air. Place this bag inside another plastic bag. Slide the bag into a pillowcase or place a damp towel between your skin and the bag. SEEK MEDICAL CARE IF:  You develop white spots on your skin. This may give the skin a blotchy (mottled) appearance.  Your skin turns blue or pale.  Your skin becomes waxy or hard.  Your swelling gets worse. MAKE SURE YOU:   Understand these instructions.  Will watch your condition.  Will get help right away if you are not doing well or get worse. Document Released: 03/20/2011 Document Revised: 12/08/2013  Document Reviewed: 03/20/2011 [facility] Patient Information 2015 [place], [place]. This information is not intended to replace advice given to you by your health care provider. Make sure you discuss any questions you have with your health care provider.
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
## 2015-04-29 DIAGNOSIS — Z87891 Personal history of nicotine dependence: Secondary | ICD-10-CM | POA: Insufficient documentation

## 2015-04-29 DIAGNOSIS — Z8709 Personal history of other diseases of the respiratory system: Secondary | ICD-10-CM | POA: Insufficient documentation

## 2015-04-29 DIAGNOSIS — Z791 Long term (current) use of non-steroidal anti-inflammatories (NSAID): Secondary | ICD-10-CM | POA: Diagnosis not present

## 2015-04-29 DIAGNOSIS — K088 Other specified disorders of teeth and supporting structures: Secondary | ICD-10-CM | POA: Diagnosis present

## 2015-04-29 DIAGNOSIS — Z79899 Other long term (current) drug therapy: Secondary | ICD-10-CM | POA: Diagnosis not present

## 2015-04-30 ENCOUNTER — Emergency Department (HOSPITAL_COMMUNITY)
Admission: EM | Admit: 2015-04-30 | Discharge: 2015-04-30 | Disposition: A | Discharge status: Home / Self Care | Payer: Medicaid Other | Attending: Emergency Medicine | Admitting: Emergency Medicine

## 2015-04-30 ENCOUNTER — Encounter (HOSPITAL_COMMUNITY): Payer: Self-pay | Admitting: Emergency Medicine

## 2015-04-30 DIAGNOSIS — K0889 Other specified disorders of teeth and supporting structures: Secondary | ICD-10-CM

## 2015-04-30 MED ORDER — PENICILLIN V POTASSIUM 500 MG PO TABS: 500.0000 mg | ORAL_TABLET | Freq: Four times a day (QID) | ORAL | Status: DC

## 2015-04-30 MED ORDER — PENICILLIN V POTASSIUM 250 MG PO TABS
500.0000 mg | ORAL_TABLET | Freq: Once | ORAL | Status: AC
  Administered 2015-04-30: 500 mg via ORAL
  Filled 2015-04-30: qty 2

## 2015-04-30 MED ORDER — NAPROXEN 500 MG PO TABS: 500.0000 mg | ORAL_TABLET | Freq: Two times a day (BID) | ORAL | Status: DC

## 2015-04-30 MED ORDER — NAPROXEN 250 MG PO TABS
500.0000 mg | ORAL_TABLET | Freq: Once | ORAL | Status: AC
  Administered 2015-04-30: 500 mg via ORAL
  Filled 2015-04-30: qty 2

## 2015-04-30 NOTE — Discharge Instructions (Signed)
Take naprosyn as needed for pain. Take Veetid as directed until gone. Refer to attached documents for more information.

## 2015-04-30 NOTE — ED Provider Notes (Signed)
CSN: [id number]     Arrival date & time 04/29/15  2341 History   First MD Initiated Contact with Patient 04/30/15 0019     Chief Complaint  Patient presents with  . Dental Pain     (Consider location/radiation/quality/duration/timing/severity/associated sxs/prior Treatment) HPI Comments: The patient is a 34 year old otherwise healthy female who presents with dental pain that started gradually yesterday. The dental pain is severe, constant and progressively worsening. The pain is aching and located in right lower molars. The pain does not radiate. Eating makes the pain worse. Nothing makes the pain better. The patient has not tried anything for pain. No associated symptoms. Patient denies headache, neck pain/stiffness, fever, NVD, edema, sore throat, throat swelling, wheezing, SOB, chest pain, abdominal pain.      Past Medical History  Diagnosis Date  . No pertinent past medical history   . Abnormal Pap smear   . Bronchitis    Past Surgical History  Procedure Laterality Date  . No past surgeries    . Wisdom tooth extraction     Family History  Problem Relation Age of Onset  . Arthritis Mother   . Hypertension Mother   . Diabetes Mother   . Asthma Mother   . Arthritis Father   . Hypertension Father   . Diabetes Father    Social History  Substance Use Topics  . Smoking status: Former Smoker -- 0.00 packs/day for .5 years  . Smokeless tobacco: Never Used  . Alcohol Use: Yes     Comment: Occassionally.    OB History    Gravida Para Term Preterm AB TAB SAB Ectopic Multiple Living   Review of Systems  HENT: Positive for dental problem.   All other systems reviewed and are negative.     Allergies  Review of patient's allergies indicates no known allergies.  Home Medications   Prior to Admission medications   Medication Sig Start Date End Date Taking? Authorizing Provider  azithromycin (ZITHROMAX Z-PAK) 250 MG tablet Take 1 tablet (250 mg  total) by mouth daily. Take two tablets by mouth on day one followed by one tablet by mouth daily on days 2 through 5 Patient not taking: Reported on 04/12/2015 03/09/15   Tilden Fossa, MD  ibuprofen (ADVIL,MOTRIN) 800 MG tablet Take 1 tablet (800 mg total) by mouth 3 (three) times daily. 04/12/15   Elson Areas, PA-C  methocarbamol (ROBAXIN) 500 MG tablet Take 1 tablet (500 mg total) by mouth 2 (two) times daily. 04/12/15   Lonia Skinner Sofia, PA-C   BP 102/58 mmHg  Pulse 89  Temp(Src) 98.7 F (37.1 C) (Oral)  Resp 18  Ht  (1.651 m)  Wt 189 lb 1 oz (85.758 kg)  BMI 31.46 kg/m2  SpO2 100%  LMP 04/12/2015 Physical Exam  Constitutional: She is oriented to person, place, and time. She appears well-developed and well-nourished. No distress.  HENT:  Head: Normocephalic and atraumatic.  Mouth/Throat: Oropharynx is clear and moist. No oropharyngeal exudate.  Good dentition. Tenderness to percussion of right lower molars. No abscess or dental caries noted.   Eyes: Conjunctivae and EOM are normal.  Neck: Normal range of motion.  Cardiovascular: Normal rate and regular rhythm.  Exam reveals no gallop and no friction rub.   No murmur heard. Pulmonary/Chest: Effort normal and breath sounds normal. She has no wheezes. She has no rales. She exhibits no tenderness.  Abdominal: Soft.  She exhibits no distension. There is no tenderness. There is no rebound.  Musculoskeletal: Normal range of motion.  Neurological: She is alert and oriented to person, place, and time. Coordination normal.  Speech is goal-oriented. Moves limbs without ataxia.   Skin: Skin is warm and dry.  Psychiatric: She has a normal mood and affect. Her behavior is normal.  Nursing note and vitals reviewed.   ED Course  Procedures (including critical care time) Labs Review Labs Reviewed - No data to display  Imaging Review No results found. I have personally reviewed and evaluated these images and lab results as part of my  medical decision-making.   EKG Interpretation None      MDM   Final diagnoses:  Pain, dental    1:02 AM Patient will have Veetid and naprosyn. Patient will follow up with dentist. Vitals stable and patient afebrile. No signs of ludwig's angina.     Emilia Beck, PA-C 04/30/15 0112  Dione Booze, MD 04/30/15 [phone]

## 2015-04-30 NOTE — ED Notes (Signed)
PT c/o pain in tooth on lower left side.  Onset earlier today

## 2015-07-01 ENCOUNTER — Emergency Department (HOSPITAL_COMMUNITY)
Admission: EM | Admit: 2015-07-01 | Discharge: 2015-07-01 | Disposition: A | Discharge status: Home / Self Care | Payer: Medicaid Other | Attending: Emergency Medicine | Admitting: Emergency Medicine

## 2015-07-01 ENCOUNTER — Encounter (HOSPITAL_COMMUNITY): Payer: Self-pay | Admitting: Emergency Medicine

## 2015-07-01 DIAGNOSIS — R21 Rash and other nonspecific skin eruption: Secondary | ICD-10-CM | POA: Diagnosis present

## 2015-07-01 DIAGNOSIS — Z792 Long term (current) use of antibiotics: Secondary | ICD-10-CM | POA: Diagnosis not present

## 2015-07-01 DIAGNOSIS — L239 Allergic contact dermatitis, unspecified cause: Secondary | ICD-10-CM | POA: Diagnosis not present

## 2015-07-01 DIAGNOSIS — Z8709 Personal history of other diseases of the respiratory system: Secondary | ICD-10-CM | POA: Insufficient documentation

## 2015-07-01 DIAGNOSIS — Z791 Long term (current) use of non-steroidal anti-inflammatories (NSAID): Secondary | ICD-10-CM | POA: Insufficient documentation

## 2015-07-01 DIAGNOSIS — Z87891 Personal history of nicotine dependence: Secondary | ICD-10-CM | POA: Diagnosis not present

## 2015-07-01 MED ORDER — TRIAMCINOLONE ACETONIDE 0.025 % EX OINT: 1.0000 "application " | TOPICAL_OINTMENT | Freq: Two times a day (BID) | CUTANEOUS | Status: DC

## 2015-07-01 NOTE — ED Notes (Signed)
Pt reports rash under neck and on L arm that she noticed today.

## 2015-07-01 NOTE — ED Provider Notes (Signed)
CSN: [id number]     Arrival date & time 07/01/15  0010 History   First MD Initiated Contact with Patient 07/01/15 0020     Chief Complaint  Patient presents with  . Rash     (Consider location/radiation/quality/duration/timing/severity/associated sxs/prior Treatment) Patient is a 34 y.o. female presenting with rash. The history is provided by the patient. No language interpreter was used.  Rash Location:  Full body Quality: blistering   Severity:  Moderate Onset quality:  Gradual Duration:  1 day Timing:  Constant Progression:  Worsening Chronicity:  New Context: not medications, not new detergent/soap and not sick contacts   Relieved by:  Nothing Worsened by:  Nothing tried Ineffective treatments:  None tried Associated symptoms: no nausea     Past Medical History  Diagnosis Date  . No pertinent past medical history   . Abnormal Pap smear   . Bronchitis    Past Surgical History  Procedure Laterality Date  . No past surgeries    . Wisdom tooth extraction     Family History  Problem Relation Age of Onset  . Arthritis Mother   . Hypertension Mother   . Diabetes Mother   . Asthma Mother   . Arthritis Father   . Hypertension Father   . Diabetes Father    Social History  Substance Use Topics  . Smoking status: Former Smoker -- 0.00 packs/day for .5 years  . Smokeless tobacco: Never Used  . Alcohol Use: Yes     Comment: Occassionally.    OB History    Gravida Para Term Preterm AB TAB SAB Ectopic Multiple Living   3 2 2  1  1   2      Review of Systems  Gastrointestinal: Negative for nausea.  Skin: Positive for rash.  All other systems reviewed and are negative.     Allergies  Review of patient's allergies indicates no known allergies.  Home Medications   Prior to Admission medications   Medication Sig Start Date End Date Taking? Authorizing Provider  azithromycin (ZITHROMAX Z-PAK) 250 MG tablet Take 1 tablet (250 mg total) by mouth daily. Take two  tablets by mouth on day one followed by one tablet by mouth daily on days 2 through 5 Patient not taking: Reported on 04/12/2015 03/09/15   Tilden FossaElizabeth Rees, MD  ibuprofen (ADVIL,MOTRIN) 800 MG tablet Take 1 tablet (800 mg total) by mouth 3 (three) times daily. 04/12/15   Elson AreasLeslie K Jakira Mcfadden, PA-C  methocarbamol (ROBAXIN) 500 MG tablet Take 1 tablet (500 mg total) by mouth 2 (two) times daily. 04/12/15   Elson AreasLeslie K Rakiyah Esch, PA-C  naproxen (NAPROSYN) 500 MG tablet Take 1 tablet (500 mg total) by mouth 2 (two) times daily with a meal. 04/30/15   Emilia BeckKaitlyn Szekalski, PA-C  penicillin v potassium (VEETID) 500 MG tablet Take 1 tablet (500 mg total) by mouth 4 (four) times daily. 04/30/15   Kaitlyn Szekalski, PA-C   BP 114/68 mmHg  Pulse 90  Temp(Src) 97.5 F (36.4 C) (Oral)  Resp 16  SpO2 98%  LMP 06/17/2015 Physical Exam  Constitutional: She is oriented to person, place, and time. She appears well-developed and well-nourished.  HENT:  Head: Normocephalic.  Right Ear: External ear normal.  Left Ear: External ear normal.  Eyes: Conjunctivae and EOM are normal. Pupils are equal, round, and reactive to light.  Neck: Normal range of motion.  Cardiovascular: Normal rate.   Pulmonary/Chest: Effort normal.  Musculoskeletal: Normal range of motion.  Neurological: She is alert and  oriented to person, place, and time. She has normal reflexes.  Skin: There is erythema.  Psychiatric: She has a normal mood and affect.  Nursing note and vitals reviewed.   ED Course  Procedures (including critical care time) Labs Review Labs Reviewed - No data to display  Imaging Review No results found. I have personally reviewed and evaluated these images and lab results as part of my medical decision-making.   EKG Interpretation None      MDM triamcinalone cream   Final diagnoses:  Allergic dermatitis        Elson Areas, PA-C 07/01/15 1610  Derwood Kaplan, MD 07/01/15 [phone]

## 2015-07-01 NOTE — Discharge Instructions (Signed)
Contact Dermatitis Dermatitis is redness, soreness, and swelling (inflammation) of the skin. Contact dermatitis is a reaction to certain substances that touch the skin. There are two types of contact dermatitis:   Irritant contact dermatitis. This type is caused by something that irritates your skin, such as dry hands from washing them too much. This type does not require previous exposure to the substance for a reaction to occur. This type is more common.  Allergic contact dermatitis. This type is caused by a substance that you are allergic to, such as a nickel allergy or poison ivy. This type only occurs if you have been exposed to the substance (allergen) before. Upon a repeat exposure, your body reacts to the substance. This type is less common. CAUSES  Many different substances can cause contact dermatitis. Irritant contact dermatitis is most commonly caused by exposure to:   Makeup.   Soaps.   Detergents.   Bleaches.   Acids.   Metal salts, such as nickel.  Allergic contact dermatitis is most commonly caused by exposure to:   Poisonous plants.   Chemicals.   Jewelry.   Latex.   Medicines.   Preservatives in products, such as clothing.  RISK FACTORS This condition is more likely to develop in:   People who have jobs that expose them to irritants or allergens.  People who have certain medical conditions, such as asthma or eczema.  SYMPTOMS  Symptoms of this condition may occur anywhere on your body where the irritant has touched you or is touched by you. Symptoms include:  Dryness or flaking.   Redness.   Cracks.   Itching.   Pain or a burning feeling.   Blisters.  Drainage of small amounts of blood or clear fluid from skin cracks. With allergic contact dermatitis, there may also be swelling in areas such as the eyelids, mouth, or genitals.  DIAGNOSIS  This condition is diagnosed with a medical history and physical exam. A patch skin test  may be performed to help determine the cause. If the condition is related to your job, you may need to see an occupational medicine specialist. TREATMENT Treatment for this condition includes figuring out what caused the reaction and protecting your skin from further contact. Treatment may also include:   Steroid creams or ointments. Oral steroid medicines may be needed in more severe cases.  Antibiotics or antibacterial ointments, if a skin infection is present.  Antihistamine lotion or an antihistamine taken by mouth to ease itching.  A bandage (dressing). HOME CARE INSTRUCTIONS Skin Care  Moisturize your skin as needed.   Apply cool compresses to the affected areas.  Try taking a bath with:  Epsom salts. Follow the instructions on the packaging. You can get these at your local pharmacy or grocery store.  Baking soda. Pour a small amount into the bath as directed by your health care provider.  Colloidal oatmeal. Follow the instructions on the packaging. You can get this at your local pharmacy or grocery store.  Try applying baking soda paste to your skin. Stir water into baking soda until it reaches a paste-like consistency.  Do not scratch your skin.  Bathe less frequently, such as every other day.  Bathe in lukewarm water. Avoid using hot water. Medicines  Take or apply over-the-counter and prescription medicines only as told by your health care provider.   If you were prescribed an antibiotic medicine, take or apply your antibiotic as told by your health care provider. Do not stop using the   antibiotic even if your condition starts to improve. General Instructions  Keep all follow-up visits as told by your health care provider. This is important.  Avoid the substance that caused your reaction. If you do not know what caused it, keep a journal to try to track what caused it. Write down:  What you eat.  What cosmetic products you use.  What you drink.  What  you wear in the affected area. This includes jewelry.  If you were given a dressing, take care of it as told by your health care provider. This includes when to change and remove it. SEEK MEDICAL CARE IF:   Your condition does not improve with treatment.  Your condition gets worse.  You have signs of infection such as swelling, tenderness, redness, soreness, or warmth in the affected area.  You have a fever.  You have new symptoms. SEEK IMMEDIATE MEDICAL CARE IF:   You have a severe headache, neck pain, or neck stiffness.  You vomit.  You feel very sleepy.  You notice red streaks coming from the affected area.  Your bone or joint underneath the affected area becomes painful after the skin has healed.  The affected area turns darker.  You have difficulty breathing.   This information is not intended to replace advice given to you by your health care provider. Make sure you discuss any questions you have with your health care provider.   Document Released: 07/21/2000 Document Revised: 04/14/2015 Document Reviewed: 12/09/2014 Elsevier Interactive Patient Education 2016 Elsevier Inc[employer]  

## 2015-07-01 NOTE — ED Notes (Signed)
Patient is alert and orientedx4.  Patient was explained discharge instructions and they understood them with no questions.   

## 2015-07-01 NOTE — ED Notes (Signed)
Family at bedside. 

## 2015-08-12 ENCOUNTER — Encounter (HOSPITAL_COMMUNITY): Payer: Self-pay | Admitting: Emergency Medicine

## 2015-08-12 ENCOUNTER — Emergency Department (HOSPITAL_COMMUNITY)
Admission: EM | Admit: 2015-08-12 | Discharge: 2015-08-12 | Disposition: A | Discharge status: Home / Self Care | Payer: Medicaid Other | Attending: Emergency Medicine | Admitting: Emergency Medicine

## 2015-08-12 DIAGNOSIS — J209 Acute bronchitis, unspecified: Secondary | ICD-10-CM | POA: Diagnosis not present

## 2015-08-12 DIAGNOSIS — Z87891 Personal history of nicotine dependence: Secondary | ICD-10-CM | POA: Insufficient documentation

## 2015-08-12 DIAGNOSIS — Z79899 Other long term (current) drug therapy: Secondary | ICD-10-CM | POA: Insufficient documentation

## 2015-08-12 DIAGNOSIS — Z791 Long term (current) use of non-steroidal anti-inflammatories (NSAID): Secondary | ICD-10-CM | POA: Diagnosis not present

## 2015-08-12 DIAGNOSIS — L089 Local infection of the skin and subcutaneous tissue, unspecified: Secondary | ICD-10-CM | POA: Diagnosis not present

## 2015-08-12 DIAGNOSIS — L989 Disorder of the skin and subcutaneous tissue, unspecified: Secondary | ICD-10-CM | POA: Diagnosis present

## 2015-08-12 DIAGNOSIS — Z7952 Long term (current) use of systemic steroids: Secondary | ICD-10-CM | POA: Insufficient documentation

## 2015-08-12 DIAGNOSIS — Z792 Long term (current) use of antibiotics: Secondary | ICD-10-CM | POA: Insufficient documentation
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
## 2015-08-12 NOTE — ED Notes (Addendum)
Patient c/o bump on left posterior head.  First noted bump 3 days ago.  Reports bump is burning and itching.  No meds PTA.  No known injury.  Mother would like to be checked for lice.

## 2015-08-12 NOTE — Discharge Instructions (Signed)
Warm compresses to the area several times a day. Apply bacitracin ointment twice a day. Return if getting larger or follow up with your doctor.

## 2015-10-05 ENCOUNTER — Emergency Department (HOSPITAL_COMMUNITY): Payer: Medicaid Other

## 2015-10-05 ENCOUNTER — Emergency Department (HOSPITAL_COMMUNITY)
Admission: EM | Admit: 2015-10-05 | Discharge: 2015-10-05 | Disposition: A | Discharge status: Home / Self Care | Payer: Medicaid Other | Attending: Emergency Medicine | Admitting: Emergency Medicine

## 2015-10-05 ENCOUNTER — Encounter (HOSPITAL_COMMUNITY): Payer: Self-pay

## 2015-10-05 ENCOUNTER — Encounter (HOSPITAL_COMMUNITY): Payer: Self-pay | Admitting: Emergency Medicine

## 2015-10-05 DIAGNOSIS — R079 Chest pain, unspecified: Secondary | ICD-10-CM | POA: Insufficient documentation

## 2015-10-05 DIAGNOSIS — Z87828 Personal history of other (healed) physical injury and trauma: Secondary | ICD-10-CM | POA: Insufficient documentation

## 2015-10-05 DIAGNOSIS — Z79899 Other long term (current) drug therapy: Secondary | ICD-10-CM | POA: Diagnosis not present

## 2015-10-05 DIAGNOSIS — Z87891 Personal history of nicotine dependence: Secondary | ICD-10-CM | POA: Insufficient documentation

## 2015-10-05 DIAGNOSIS — Z7952 Long term (current) use of systemic steroids: Secondary | ICD-10-CM | POA: Insufficient documentation

## 2015-10-05 DIAGNOSIS — Z792 Long term (current) use of antibiotics: Secondary | ICD-10-CM | POA: Diagnosis not present

## 2015-10-05 DIAGNOSIS — K0889 Other specified disorders of teeth and supporting structures: Secondary | ICD-10-CM | POA: Diagnosis not present

## 2015-10-05 DIAGNOSIS — Z8709 Personal history of other diseases of the respiratory system: Secondary | ICD-10-CM | POA: Insufficient documentation

## 2015-10-05 DIAGNOSIS — R0789 Other chest pain: Secondary | ICD-10-CM | POA: Diagnosis not present

## 2015-10-05 LAB — BASIC METABOLIC PANEL
ANION GAP: 9 (ref 5–15)
BUN: 10 mg/dL (ref 6–20)
CALCIUM: 9.1 mg/dL (ref 8.9–10.3)
CO2: 24 mmol/L (ref 22–32)
Chloride: 106 mmol/L (ref 101–111)
Creatinine, Ser: 1 mg/dL (ref 0.44–1.00)
Glucose, Bld: 112 mg/dL — ABNORMAL HIGH (ref 65–99)
Potassium: 4 mmol/L (ref 3.5–5.1)
Sodium: 139 mmol/L (ref 135–145)

## 2015-10-05 LAB — CBC
HEMATOCRIT: 35.2 % — AB (ref 36.0–46.0)
Hemoglobin: 10.9 g/dL — ABNORMAL LOW (ref 12.0–15.0)
MCH: 24.5 pg — ABNORMAL LOW (ref 26.0–34.0)
MCHC: 31 g/dL (ref 30.0–36.0)
MCV: 79.3 fL (ref 78.0–100.0)
PLATELETS: 277 10*3/uL (ref 150–400)
RBC: 4.44 MIL/uL (ref 3.87–5.11)
RDW: 14.6 % (ref 11.5–15.5)
WBC: 8.2 10*3/uL (ref 4.0–10.5)

## 2015-10-05 LAB — I-STAT TROPONIN, ED: Troponin i, poc: 0 ng/mL (ref 0.00–0.08)

## 2015-10-05 MED ORDER — IBUPROFEN 800 MG PO TABS: 800.0000 mg | ORAL_TABLET | Freq: Three times a day (TID) | ORAL | Status: DC
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
## 2015-10-05 NOTE — Discharge Instructions (Signed)
Chest Wall Pain Chest wall pain is pain in or around the bones and muscles of your chest. Sometimes, an injury causes this pain. Sometimes, the cause may not be known. This pain may take several weeks or longer to get better. HOME CARE INSTRUCTIONS  Pay attention to any changes in your symptoms. Take these actions to help with your pain:   Rest as told by your health care provider.   Avoid activities that cause pain. These include any activities that use your chest muscles or your abdominal and side muscles to lift heavy items.   If directed, apply ice to the painful area:  Put ice in a plastic bag.  Place a towel between your skin and the bag.  Leave the ice on for 20 minutes, 2-3 times per day.  Take over-the-counter and prescription medicines only as told by your health care provider.  Do not use tobacco products, including cigarettes, chewing tobacco, and e-cigarettes. If you need help quitting, ask your health care provider.  Keep all follow-up visits as told by your health care provider. This is important. SEEK MEDICAL CARE IF:  You have a fever.  Your chest pain becomes worse.  You have new symptoms. SEEK IMMEDIATE MEDICAL CARE IF:  You have nausea or vomiting.  You feel sweaty or light-headed.  You have a cough with phlegm (sputum) or you cough up blood.  You develop shortness of breath.   This information is not intended to replace advice given to you by your health care provider. Make sure you discuss any questions you have with your health care provider.   Document Released: 07/24/2005 Document Revised: 04/14/2015 Document Reviewed: 10/19/2014 Elsevier Interactive Patient Education 2016 Elsevier Inc. Dental Pain Dental pain may be caused by many things, including:  Tooth decay (cavities or caries). Cavities expose the nerve of your tooth to air and hot or cold temperatures. This can cause pain or discomfort.  Abscess or infection. A dental abscess is a  collection of infected pus from a bacterial infection in the inner part of the tooth (pulp). It usually occurs at the end of the tooth's root.  Injury.  An unknown reason (idiopathic). Your pain may be mild or severe. It may only occur when:  You are chewing.  You are exposed to hot or cold temperature.  You are eating or drinking sugary foods or beverages, such as soda or candy. Your pain may also be constant. HOME CARE INSTRUCTIONS Watch your dental pain for any changes. The following actions may help to lessen any discomfort that you are feeling:  Take medicines only as directed by your dentist.  If you were prescribed an antibiotic medicine, finish all of it even if you start to feel better.  Keep all follow-up visits as directed by your dentist. This is important.  Do not apply heat to the outside of your face.  Rinse your mouth or gargle with salt water if directed by your dentist. This helps with pain and swelling.  You can make salt water by adding  tsp of salt to 1 cup of warm water.  Apply ice to the painful area of your face:  Put ice in a plastic bag.  Place a towel between your skin and the bag.  Leave the ice on for 20 minutes, 2-3 times per day.  Avoid foods or drinks that cause you pain, such as:  Very hot or very cold foods or drinks.  Sweet or sugary foods or drinks. SEEK MEDICAL  CARE IF:  Your pain is not controlled with medicines.  Your symptoms are worse.  You have new symptoms. SEEK IMMEDIATE MEDICAL CARE IF:  You are unable to open your mouth.  You are having trouble breathing or swallowing.  You have a fever.  Your face, neck, or jaw is swollen.   This information is not intended to replace advice given to you by your health care provider. Make sure you discuss any questions you have with your health care provider.   Document Released: 07/24/2005 Document Revised: 12/08/2014 Document Reviewed: 07/20/2014 Elsevier Interactive  Patient Education [employer]
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
## 2015-10-05 NOTE — ED Notes (Addendum)
Pt reports she has central "needle like" chest pain that began 1 week ago when she began experiencing cold symptoms. The pain is reproducible and is intensified when she coughs or takes a deep breath.  Pt also has c/o pain where her wisdom teeth were removed. This RN does not see any obvious swelling to these areas.

## 2015-10-05 NOTE — ED Notes (Signed)
Pt complains of needle like chest pain for one week and worse last night, she also states that she's having dental pain from where she had her wisdom tooth pulled last Erica [name]

## 2015-11-07 DIAGNOSIS — M6281 Muscle weakness (generalized): Secondary | ICD-10-CM | POA: Diagnosis not present

## 2015-11-07 DIAGNOSIS — Z8709 Personal history of other diseases of the respiratory system: Secondary | ICD-10-CM | POA: Diagnosis not present

## 2015-11-07 DIAGNOSIS — Z3202 Encounter for pregnancy test, result negative: Secondary | ICD-10-CM | POA: Diagnosis not present

## 2015-11-07 DIAGNOSIS — Z87828 Personal history of other (healed) physical injury and trauma: Secondary | ICD-10-CM | POA: Diagnosis not present

## 2015-11-07 DIAGNOSIS — R5383 Other fatigue: Secondary | ICD-10-CM | POA: Insufficient documentation

## 2015-11-07 DIAGNOSIS — B349 Viral infection, unspecified: Secondary | ICD-10-CM | POA: Diagnosis not present

## 2015-11-07 DIAGNOSIS — Z87891 Personal history of nicotine dependence: Secondary | ICD-10-CM | POA: Insufficient documentation

## 2015-11-07 DIAGNOSIS — R509 Fever, unspecified: Secondary | ICD-10-CM | POA: Diagnosis present

## 2015-11-08 ENCOUNTER — Emergency Department (HOSPITAL_COMMUNITY): Payer: Medicaid Other

## 2015-11-08 ENCOUNTER — Emergency Department (HOSPITAL_COMMUNITY)
Admission: EM | Admit: 2015-11-08 | Discharge: 2015-11-08 | Disposition: A | Discharge status: Home / Self Care | Payer: Medicaid Other | Attending: Emergency Medicine | Admitting: Emergency Medicine

## 2015-11-08 ENCOUNTER — Encounter (HOSPITAL_COMMUNITY): Payer: Self-pay | Admitting: Adult Health

## 2015-11-08 DIAGNOSIS — B349 Viral infection, unspecified: Secondary | ICD-10-CM

## 2015-11-08 LAB — URINALYSIS, ROUTINE W REFLEX MICROSCOPIC
BILIRUBIN URINE: NEGATIVE
Glucose, UA: NEGATIVE mg/dL
Ketones, ur: NEGATIVE mg/dL
Leukocytes, UA: NEGATIVE
Nitrite: NEGATIVE
Protein, ur: NEGATIVE mg/dL
SPECIFIC GRAVITY, URINE: 1.02 (ref 1.005–1.030)
pH: 6.5 (ref 5.0–8.0)

## 2015-11-08 LAB — URINE MICROSCOPIC-ADD ON: WBC, UA: NONE SEEN WBC/hpf (ref 0–5)

## 2015-11-08 LAB — CBC WITH DIFFERENTIAL/PLATELET
Basophils Absolute: 0 10*3/uL (ref 0.0–0.1)
Basophils Relative: 0 %
EOS ABS: 0.1 10*3/uL (ref 0.0–0.7)
EOS PCT: 2 %
HCT: 33.6 % — ABNORMAL LOW (ref 36.0–46.0)
Hemoglobin: 11 g/dL — ABNORMAL LOW (ref 12.0–15.0)
LYMPHS ABS: 1.6 10*3/uL (ref 0.7–4.0)
LYMPHS PCT: 29 %
MCH: 25.9 pg — AB (ref 26.0–34.0)
MCHC: 32.7 g/dL (ref 30.0–36.0)
MCV: 79.1 fL (ref 78.0–100.0)
MONO ABS: 0.7 10*3/uL (ref 0.1–1.0)
Monocytes Relative: 14 %
Neutro Abs: 2.9 10*3/uL (ref 1.7–7.7)
Neutrophils Relative %: 55 %
Platelets: 195 10*3/uL (ref 150–400)
RBC: 4.25 MIL/uL (ref 3.87–5.11)
RDW: 14.3 % (ref 11.5–15.5)
WBC: 5.4 10*3/uL (ref 4.0–10.5)

## 2015-11-08 LAB — POC URINE PREG, ED: PREG TEST UR: NEGATIVE

## 2015-11-08 LAB — BASIC METABOLIC PANEL
Anion gap: 8 (ref 5–15)
BUN: 9 mg/dL (ref 6–20)
CHLORIDE: 106 mmol/L (ref 101–111)
CO2: 24 mmol/L (ref 22–32)
CREATININE: 1 mg/dL (ref 0.44–1.00)
Calcium: 9 mg/dL (ref 8.9–10.3)
GFR calc Af Amer: >60 mL/min (ref >60)
GFR calc non Af Amer: >60 mL/min (ref >60)
Glucose, Bld: 99 mg/dL (ref 65–99)
Potassium: 3.9 mmol/L (ref 3.5–5.1)
SODIUM: 138 mmol/L (ref 135–145)

## 2015-11-08 MED ORDER — ONDANSETRON 4 MG PO TBDP
ORAL_TABLET | ORAL | Status: AC
  Filled 2015-11-08: qty 1

## 2015-11-08 MED ORDER — IBUPROFEN 600 MG PO TABS: 600.0000 mg | ORAL_TABLET | Freq: Four times a day (QID) | ORAL | Status: DC | PRN

## 2015-11-08 MED ORDER — PROMETHAZINE HCL 25 MG PO TABS: 25.0000 mg | ORAL_TABLET | Freq: Four times a day (QID) | ORAL | Status: DC | PRN

## 2015-11-08 MED ORDER — ACETAMINOPHEN 500 MG PO TABS
1000.0000 mg | ORAL_TABLET | Freq: Once | ORAL | Status: AC
  Administered 2015-11-08: 1000 mg via ORAL
  Filled 2015-11-08: qty 2

## 2015-11-08 MED ORDER — ONDANSETRON HCL 4 MG/2ML IJ SOLN
4.0000 mg | Freq: Once | INTRAMUSCULAR | Status: AC
  Administered 2015-11-08: 4 mg via INTRAVENOUS
  Filled 2015-11-08: qty 2

## 2015-11-08 MED ORDER — ONDANSETRON 4 MG PO TBDP
4.0000 mg | ORAL_TABLET | Freq: Once | ORAL | Status: AC
  Administered 2015-11-08: 4 mg via ORAL

## 2015-11-08 MED ORDER — KETOROLAC TROMETHAMINE 30 MG/ML IJ SOLN
30.0000 mg | Freq: Once | INTRAMUSCULAR | Status: AC
  Administered 2015-11-08: 30 mg via INTRAVENOUS
  Filled 2015-11-08: qty 1

## 2015-11-08 MED ORDER — SODIUM CHLORIDE 0.9 % IV BOLUS (SEPSIS)
1000.0000 mL | Freq: Once | INTRAVENOUS | Status: AC
  Administered 2015-11-08: 1000 mL via INTRAVENOUS

## 2015-11-08 MED ORDER — BENZONATATE 100 MG PO CAPS: 100.0000 mg | ORAL_CAPSULE | Freq: Three times a day (TID) | ORAL | Status: DC | PRN
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
## 2015-11-08 NOTE — ED Notes (Signed)
Presents with fever, vomting, cough with yellow phlegm, diarrhea and general fatigue-began 4 days ago associated with fevers of 100.4 at home. Pt states she is unable to keep any fluids or anything down. Endorses nausea.

## 2015-11-08 NOTE — Discharge Instructions (Signed)

## 2015-11-08 NOTE — ED Notes (Signed)
Provided patient with turkey sandwich and ice water per MD. 

## 2015-12-15 ENCOUNTER — Encounter (HOSPITAL_COMMUNITY): Payer: Self-pay | Admitting: Nurse Practitioner

## 2015-12-15 ENCOUNTER — Emergency Department (HOSPITAL_COMMUNITY)
Admission: EM | Admit: 2015-12-15 | Discharge: 2015-12-16 | Disposition: A | Discharge status: Home / Self Care | Payer: Medicaid Other | Attending: Emergency Medicine | Admitting: Emergency Medicine

## 2015-12-15 DIAGNOSIS — Z8709 Personal history of other diseases of the respiratory system: Secondary | ICD-10-CM | POA: Insufficient documentation

## 2015-12-15 DIAGNOSIS — Z87891 Personal history of nicotine dependence: Secondary | ICD-10-CM | POA: Diagnosis not present

## 2015-12-15 DIAGNOSIS — R079 Chest pain, unspecified: Secondary | ICD-10-CM | POA: Diagnosis present

## 2015-12-15 DIAGNOSIS — Z87828 Personal history of other (healed) physical injury and trauma: Secondary | ICD-10-CM | POA: Insufficient documentation

## 2015-12-15 LAB — BASIC METABOLIC PANEL
ANION GAP: 7 (ref 5–15)
BUN: 5 mg/dL — ABNORMAL LOW (ref 6–20)
CALCIUM: 9.3 mg/dL (ref 8.9–10.3)
CHLORIDE: 107 mmol/L (ref 101–111)
CO2: 25 mmol/L (ref 22–32)
Creatinine, Ser: 0.91 mg/dL (ref 0.44–1.00)
GFR calc non Af Amer: >60 mL/min (ref >60)
Glucose, Bld: 104 mg/dL — ABNORMAL HIGH (ref 65–99)
POTASSIUM: 3.4 mmol/L — AB (ref 3.5–5.1)
Sodium: 139 mmol/L (ref 135–145)

## 2015-12-15 LAB — CBC
HEMATOCRIT: 35 % — AB (ref 36.0–46.0)
HEMOGLOBIN: 11 g/dL — AB (ref 12.0–15.0)
MCH: 24.7 pg — AB (ref 26.0–34.0)
MCHC: 31.4 g/dL (ref 30.0–36.0)
MCV: 78.5 fL (ref 78.0–100.0)
Platelets: 274 10*3/uL (ref 150–400)
RBC: 4.46 MIL/uL (ref 3.87–5.11)
RDW: 14.3 % (ref 11.5–15.5)
WBC: 7 10*3/uL (ref 4.0–10.5)

## 2015-12-15 LAB — I-STAT TROPONIN, ED
TROPONIN I, POC: 0 ng/mL (ref 0.00–0.08)
Troponin i, poc: 0 ng/mL (ref 0.00–0.08)

## 2015-12-15 MED ORDER — NAPROXEN 500 MG PO TABS: 500.0000 mg | ORAL_TABLET | Freq: Two times a day (BID) | ORAL | Status: DC

## 2015-12-15 MED ORDER — DIAZEPAM 5 MG PO TABS: 5.0000 mg | ORAL_TABLET | Freq: Two times a day (BID) | ORAL | Status: DC

## 2015-12-15 MED ORDER — DIAZEPAM 5 MG PO TABS
5.0000 mg | ORAL_TABLET | Freq: Once | ORAL | Status: AC
  Administered 2015-12-15: 5 mg via ORAL
  Filled 2015-12-15: qty 1

## 2015-12-15 MED ORDER — NAPROXEN 250 MG PO TABS
500.0000 mg | ORAL_TABLET | Freq: Once | ORAL | Status: AC
  Administered 2015-12-15: 500 mg via ORAL
  Filled 2015-12-15: qty 2

## 2015-12-15 MED ORDER — ALBUTEROL SULFATE HFA 108 (90 BASE) MCG/ACT IN AERS: 1.0000 | INHALATION_SPRAY | Freq: Four times a day (QID) | RESPIRATORY_TRACT | Status: DC | PRN

## 2015-12-15 NOTE — ED Notes (Signed)
Pt updated on current waiting status.

## 2015-12-15 NOTE — ED Notes (Signed)
Updated pt on her wait time.

## 2015-12-15 NOTE — ED Notes (Signed)
Pt reports onset of chest pain this morning that woke her from her sleep. She reports pain has been constant since. She reports SOB. She denies nausea, vomiting. Nothing increases or relieves the pain. She is alert and breathing easily
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
## 2015-12-15 NOTE — Discharge Instructions (Signed)
Ms. Josiephine Orson AloeHenderson,  [place] you! Please follow-up with your primary care provider. Return to the emergency department if you develop increased chest pain, shortness of breath, nausea/vomiting, new/worsening symptoms. Feel better soon!  S. Lane HackerNicole Davionna Blacksher, PA-C

## 2015-12-15 NOTE — ED Notes (Signed)
Pt called twice for vitals to be reassessed with no answer.

## 2015-12-15 NOTE — ED Notes (Signed)
Pts name called for a room no answer 

## 2016-01-03 ENCOUNTER — Encounter (HOSPITAL_COMMUNITY): Payer: Self-pay

## 2016-01-03 ENCOUNTER — Emergency Department (HOSPITAL_COMMUNITY)
Admission: EM | Admit: 2016-01-03 | Discharge: 2016-01-03 | Disposition: A | Discharge status: Home / Self Care | Payer: Medicaid Other | Attending: Emergency Medicine | Admitting: Emergency Medicine

## 2016-01-03 ENCOUNTER — Emergency Department (HOSPITAL_COMMUNITY): Payer: Medicaid Other

## 2016-01-03 DIAGNOSIS — R05 Cough: Secondary | ICD-10-CM | POA: Insufficient documentation

## 2016-01-03 DIAGNOSIS — Z79899 Other long term (current) drug therapy: Secondary | ICD-10-CM | POA: Insufficient documentation

## 2016-01-03 DIAGNOSIS — Z3202 Encounter for pregnancy test, result negative: Secondary | ICD-10-CM | POA: Insufficient documentation

## 2016-01-03 DIAGNOSIS — R0789 Other chest pain: Secondary | ICD-10-CM

## 2016-01-03 DIAGNOSIS — Z8709 Personal history of other diseases of the respiratory system: Secondary | ICD-10-CM | POA: Diagnosis not present

## 2016-01-03 DIAGNOSIS — Z87891 Personal history of nicotine dependence: Secondary | ICD-10-CM | POA: Insufficient documentation

## 2016-01-03 DIAGNOSIS — R079 Chest pain, unspecified: Secondary | ICD-10-CM | POA: Diagnosis present

## 2016-01-03 LAB — CBC
HEMATOCRIT: 36.9 % (ref 36.0–46.0)
HEMOGLOBIN: 11.2 g/dL — AB (ref 12.0–15.0)
MCH: 24.5 pg — AB (ref 26.0–34.0)
MCHC: 30.4 g/dL (ref 30.0–36.0)
MCV: 80.7 fL (ref 78.0–100.0)
Platelets: 252 10*3/uL (ref 150–400)
RBC: 4.57 MIL/uL (ref 3.87–5.11)
RDW: 14.5 % (ref 11.5–15.5)
WBC: 8.2 10*3/uL (ref 4.0–10.5)

## 2016-01-03 LAB — BASIC METABOLIC PANEL
ANION GAP: 5 (ref 5–15)
BUN: 7 mg/dL (ref 6–20)
CHLORIDE: 106 mmol/L (ref 101–111)
CO2: 26 mmol/L (ref 22–32)
Calcium: 9.2 mg/dL (ref 8.9–10.3)
Creatinine, Ser: 0.97 mg/dL (ref 0.44–1.00)
GFR calc non Af Amer: >60 mL/min (ref >60)
Glucose, Bld: 99 mg/dL (ref 65–99)
Potassium: 3.9 mmol/L (ref 3.5–5.1)
Sodium: 137 mmol/L (ref 135–145)

## 2016-01-03 LAB — I-STAT BETA HCG BLOOD, ED (MC, WL, AP ONLY): I-stat hCG, quantitative: <5 m[IU]/mL (ref <5)

## 2016-01-03 LAB — I-STAT TROPONIN, ED: Troponin i, poc: 0 ng/mL (ref 0.00–0.08)

## 2016-01-03 MED ORDER — KETOROLAC TROMETHAMINE 60 MG/2ML IM SOLN
30.0000 mg | Freq: Once | INTRAMUSCULAR | Status: AC
  Administered 2016-01-03: 30 mg via INTRAMUSCULAR
  Filled 2016-01-03: qty 2

## 2016-01-03 NOTE — Discharge Instructions (Signed)
You have had your dose of NSAIDs today, you can start taking ibuprofen tomorrow.  For pain control please take ibuprofen (also known as Motrin or Advil) 800mg  (this is normally 4 over the counter pills) 3 times a day  for 5 days. Take with food to minimize stomach irritation.  Please follow with your primary care doctor in the next 2 days for a check-up. They must obtain records for further management.   Do not hesitate to return to the Emergency Department for any new, worsening or concerning symptoms.    Chest Wall Pain Chest wall pain is pain in or around the bones and muscles of your chest. Sometimes, an injury causes this pain. Sometimes, the cause may not be known. This pain may take several weeks or longer to get better. HOME CARE INSTRUCTIONS  Pay attention to any changes in your symptoms. Take these actions to help with your pain:   Rest as told by your health care provider.   Avoid activities that cause pain. These include any activities that use your chest muscles or your abdominal and side muscles to lift heavy items.   If directed, apply ice to the painful area:  Put ice in a plastic bag.  Place a towel between your skin and the bag.  Leave the ice on for 20 minutes, 2-3 times per day.  Take over-the-counter and prescription medicines only as told by your health care provider.  Do not use tobacco products, including cigarettes, chewing tobacco, and e-cigarettes. If you need help quitting, ask your health care provider.  Keep all follow-up visits as told by your health care provider. This is important. SEEK MEDICAL CARE IF:  You have a fever.  Your chest pain becomes worse.  You have new symptoms. SEEK IMMEDIATE MEDICAL CARE IF:  You have nausea or vomiting.  You feel sweaty or light-headed.  You have a cough with phlegm (sputum) or you cough up blood.  You develop shortness of breath.   This information is not intended to replace advice given to you by  your health care provider. Make sure you discuss any questions you have with your health care provider.   Document Released: 07/24/2005 Document Revised: 04/14/2015 Document Reviewed: 10/19/2014 Elsevier Interactive Patient Education [employer]2016 [employer] Inc[employer]
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
## 2016-01-03 NOTE — ED Notes (Signed)
Pt comes from home via [facility], c/o chest wall pain, several weeks ago diagnosed with pneumonia, finished abt, no c/o of pain when she coughs and takes a deep breath.

## 2016-01-11 ENCOUNTER — Emergency Department (HOSPITAL_COMMUNITY)
Admission: EM | Admit: 2016-01-11 | Discharge: 2016-01-11 | Disposition: A | Discharge status: Home / Self Care | Payer: Medicaid Other | Attending: Emergency Medicine | Admitting: Emergency Medicine

## 2016-01-11 ENCOUNTER — Encounter (HOSPITAL_COMMUNITY): Payer: Self-pay

## 2016-01-11 DIAGNOSIS — K0889 Other specified disorders of teeth and supporting structures: Secondary | ICD-10-CM | POA: Diagnosis not present

## 2016-01-11 NOTE — ED Notes (Signed)
Pt called to room, no answer  

## 2016-01-11 NOTE — ED Notes (Signed)
Patient complains of left lower dental pain x 1 day. States that her gums hurt

## 2016-01-11 NOTE — ED Notes (Signed)
Pt called to room, again no answer.

## 2016-01-12 ENCOUNTER — Emergency Department (HOSPITAL_COMMUNITY)
Admission: EM | Admit: 2016-01-12 | Discharge: 2016-01-12 | Disposition: A | Discharge status: Home / Self Care | Payer: Medicaid Other | Attending: Emergency Medicine | Admitting: Emergency Medicine

## 2016-01-12 ENCOUNTER — Encounter (HOSPITAL_COMMUNITY): Payer: Self-pay | Admitting: *Deleted

## 2016-01-12 DIAGNOSIS — K068 Other specified disorders of gingiva and edentulous alveolar ridge: Secondary | ICD-10-CM | POA: Diagnosis not present

## 2016-01-12 DIAGNOSIS — Z87891 Personal history of nicotine dependence: Secondary | ICD-10-CM | POA: Diagnosis not present

## 2016-01-12 DIAGNOSIS — Z8709 Personal history of other diseases of the respiratory system: Secondary | ICD-10-CM | POA: Diagnosis not present

## 2016-01-12 DIAGNOSIS — K0889 Other specified disorders of teeth and supporting structures: Secondary | ICD-10-CM | POA: Diagnosis present

## 2016-01-12 DIAGNOSIS — Z79899 Other long term (current) drug therapy: Secondary | ICD-10-CM | POA: Insufficient documentation

## 2016-01-12 MED ORDER — PENICILLIN V POTASSIUM 500 MG PO TABS: 500.0000 mg | ORAL_TABLET | Freq: Four times a day (QID) | ORAL | Status: AC

## 2016-01-12 MED ORDER — HYDROCODONE-ACETAMINOPHEN 5-325 MG PO TABS
1.0000 | ORAL_TABLET | Freq: Once | ORAL | Status: AC
  Administered 2016-01-12: 1 via ORAL
  Filled 2016-01-12: qty 1

## 2016-01-12 MED ORDER — PENICILLIN V POTASSIUM 250 MG PO TABS
500.0000 mg | ORAL_TABLET | Freq: Once | ORAL | Status: AC
  Administered 2016-01-12: 500 mg via ORAL
  Filled 2016-01-12: qty 2

## 2016-01-12 NOTE — Discharge Instructions (Signed)
Take antibiotics as prescribed. Follow up with your dentist if your symptoms do not improve. Return to the ED if you experience facial swelling, difficulty breathing or swallowing, fevers, chills.

## 2016-01-12 NOTE — ED Notes (Signed)
Discharge instructions and prescription reviewed - voiced understanding.  

## 2016-01-12 NOTE — ED Notes (Signed)
C/o dental pain to the bottom left

## 2016-01-12 NOTE — ED Notes (Signed)
See triage note done by this nurse.  No changes noted
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
## 2016-01-31 ENCOUNTER — Ambulatory Visit (INDEPENDENT_AMBULATORY_CARE_PROVIDER_SITE_OTHER): Payer: Medicaid Other

## 2016-01-31 ENCOUNTER — Encounter: Payer: Self-pay | Admitting: Obstetrics & Gynecology

## 2016-01-31 ENCOUNTER — Inpatient Hospital Stay (HOSPITAL_COMMUNITY)
Admission: AD | Admit: 2016-01-31 | Discharge: 2016-01-31 | Discharge status: Absent without leave | Payer: Medicaid Other | Attending: Obstetrics and Gynecology | Admitting: Obstetrics and Gynecology

## 2016-01-31 DIAGNOSIS — Z3201 Encounter for pregnancy test, result positive: Secondary | ICD-10-CM

## 2016-01-31 LAB — POCT PREGNANCY, URINE: Preg Test, Ur: POSITIVE — AB

## 2016-01-31 NOTE — Progress Notes (Signed)
Patient ID: Erica [name], female   DOB: 09/30/1980, 35 y.o.   MRN: [id number] Pt presents to clinic today for a pregnancy test, results were POSITIVE. Pt reports her LMP to be 12/18/15 which places her at 5557w2d pregnant.

## 2016-02-04 ENCOUNTER — Encounter (HOSPITAL_COMMUNITY): Payer: Self-pay | Admitting: *Deleted

## 2016-02-04 ENCOUNTER — Inpatient Hospital Stay (HOSPITAL_COMMUNITY)
Admission: AD | Admit: 2016-02-04 | Discharge: 2016-02-04 | Disposition: A | Discharge status: Home / Self Care | Payer: Medicaid Other | Source: Ambulatory Visit | Attending: Obstetrics & Gynecology | Admitting: Obstetrics & Gynecology

## 2016-02-04 ENCOUNTER — Inpatient Hospital Stay (HOSPITAL_COMMUNITY): Payer: Medicaid Other

## 2016-02-04 DIAGNOSIS — A499 Bacterial infection, unspecified: Secondary | ICD-10-CM

## 2016-02-04 DIAGNOSIS — Z3A00 Weeks of gestation of pregnancy not specified: Secondary | ICD-10-CM | POA: Diagnosis not present

## 2016-02-04 DIAGNOSIS — Z87891 Personal history of nicotine dependence: Secondary | ICD-10-CM | POA: Insufficient documentation

## 2016-02-04 DIAGNOSIS — O26841 Uterine size-date discrepancy, first trimester: Secondary | ICD-10-CM | POA: Diagnosis not present

## 2016-02-04 DIAGNOSIS — R109 Unspecified abdominal pain: Secondary | ICD-10-CM

## 2016-02-04 DIAGNOSIS — O23591 Infection of other part of genital tract in pregnancy, first trimester: Secondary | ICD-10-CM | POA: Diagnosis not present

## 2016-02-04 DIAGNOSIS — N760 Acute vaginitis: Secondary | ICD-10-CM | POA: Diagnosis not present

## 2016-02-04 DIAGNOSIS — O26899 Other specified pregnancy related conditions, unspecified trimester: Secondary | ICD-10-CM

## 2016-02-04 DIAGNOSIS — B9689 Other specified bacterial agents as the cause of diseases classified elsewhere: Secondary | ICD-10-CM

## 2016-02-04 LAB — URINALYSIS, ROUTINE W REFLEX MICROSCOPIC
BILIRUBIN URINE: NEGATIVE
Glucose, UA: NEGATIVE mg/dL
KETONES UR: NEGATIVE mg/dL
NITRITE: NEGATIVE
Protein, ur: NEGATIVE mg/dL
Specific Gravity, Urine: 1.01 (ref 1.005–1.030)
pH: 6.5 (ref 5.0–8.0)

## 2016-02-04 LAB — URINE MICROSCOPIC-ADD ON

## 2016-02-04 LAB — WET PREP, GENITAL
Sperm: NONE SEEN
TRICH WET PREP: NONE SEEN
YEAST WET PREP: NONE SEEN

## 2016-02-04 MED ORDER — METRONIDAZOLE 500 MG PO TABS: 500.0000 mg | ORAL_TABLET | Freq: Two times a day (BID) | ORAL | Status: DC

## 2016-02-04 NOTE — MAU Note (Addendum)
Has not felt felt movement in 2 days. Nurse downstairs said she could feel it too, doesn't really know how far along she is. Has been having ongoing vomiting.  preg confirmed at clinic downstairs.  Pain in lower abd.

## 2016-02-04 NOTE — Discharge Instructions (Signed)
Bacterial Vaginosis °Bacterial vaginosis is a vaginal infection that occurs when the normal balance of bacteria in the vagina is disrupted. It results from an overgrowth of certain bacteria. This is the most common vaginal infection in women of childbearing age. Treatment is important to prevent complications, especially in pregnant women, as it can cause a premature delivery. °CAUSES  °Bacterial vaginosis is caused by an increase in harmful bacteria that are normally present in smaller amounts in the vagina. Several different kinds of bacteria can cause bacterial vaginosis. However, the reason that the condition develops is not fully understood. °RISK FACTORS °Certain activities or behaviors can put you at an increased risk of developing bacterial vaginosis, including: °· Having a new sex partner or multiple sex partners. °· Douching. °· Using an intrauterine device (IUD) for contraception. °Women do not get bacterial vaginosis from toilet seats, bedding, swimming pools, or contact with objects around them. °SIGNS AND SYMPTOMS  °Some women with bacterial vaginosis have no signs or symptoms. Common symptoms include: °· Grey vaginal discharge. °· A fishlike odor with discharge, especially after sexual intercourse. °· Itching or burning of the vagina and vulva. °· Burning or pain with urination. °DIAGNOSIS  °Your health care provider will take a medical history and examine the vagina for signs of bacterial vaginosis. A sample of vaginal fluid may be taken. Your health care provider will look at this sample under a microscope to check for bacteria and abnormal cells. A vaginal pH test may also be done.  °TREATMENT  °Bacterial vaginosis may be treated with antibiotic medicines. These may be given in the form of a pill or a vaginal cream. A second round of antibiotics may be prescribed if the condition comes back after treatment. Because bacterial vaginosis increases your risk for sexually transmitted diseases, getting  treated can help reduce your risk for chlamydia, gonorrhea, HIV, and herpes. °HOME CARE INSTRUCTIONS  °· Only take over-the-counter or prescription medicines as directed by your health care provider. °· If antibiotic medicine was prescribed, take it as directed. Make sure you finish it even if you start to feel better. °· Tell all sexual partners that you have a vaginal infection. They should see their health care provider and be treated if they have problems, such as a mild rash or itching. °· During treatment, it is important that you follow these instructions: °· Avoid sexual activity or use condoms correctly. °· Do not douche. °· Avoid alcohol as directed by your health care provider. °· Avoid breastfeeding as directed by your health care provider. °SEEK MEDICAL CARE IF:  °· Your symptoms are not improving after 3 days of treatment. °· You have increased discharge or pain. °· You have a fever. °MAKE SURE YOU:  °· Understand these instructions. °· Will watch your condition. °· Will get help right away if you are not doing well or get worse. °FOR MORE INFORMATION  °Centers for Disease Control and Prevention, Division of STD Prevention: www.cdc.gov/std °American Sexual Health Association (ASHA): www.ashastd.org  °  °This information is not intended to replace advice given to you by your health care provider. Make sure you discuss any questions you have with your health care provider. °  °Document Released: 07/24/2005 Document Revised: 08/14/2014 Document Reviewed: 03/05/2013 °Elsevier Interactive Patient Education ©2016 [employer] °First Trimester of Pregnancy °The first trimester of pregnancy is from week 1 until the end of week 12 (months 1 through 3). A week after a sperm fertilizes an egg, the egg will implant on the   wall of the uterus. This embryo will begin to develop into a baby. Genes from you and your partner are forming the baby. The female genes determine whether the baby is a boy or a girl. At 6-8  weeks, the eyes and face are formed, and the heartbeat can be seen on ultrasound. At the end of 12 weeks, all the baby's organs are formed.  °Now that you are pregnant, you will want to do everything you can to have a healthy baby. Two of the most important things are to get good prenatal care and to follow your health care provider's instructions. Prenatal care is all the medical care you receive before the baby's birth. This care will help prevent, find, and treat any problems during the pregnancy and childbirth. °BODY CHANGES °Your body goes through many changes during pregnancy. The changes vary from woman to woman.  °· You may gain or lose a couple of pounds at first. °· You may feel sick to your stomach (nauseous) and throw up (vomit). If the vomiting is uncontrollable, call your health care provider. °· You may tire easily. °· You may develop headaches that can be relieved by medicines approved by your health care provider. °· You may urinate more often. Painful urination may mean you have a bladder infection. °· You may develop heartburn as a result of your pregnancy. °· You may develop constipation because certain hormones are causing the muscles that push waste through your intestines to slow down. °· You may develop hemorrhoids or swollen, bulging veins (varicose veins). °· Your breasts may begin to grow larger and become tender. Your nipples may stick out more, and the tissue that surrounds them (areola) may become darker. °· Your gums may bleed and may be sensitive to brushing and flossing. °· Dark spots or blotches (chloasma, mask of pregnancy) may develop on your face. This will likely fade after the baby is born. °· Your menstrual periods will stop. °· You may have a loss of appetite. °· You may develop cravings for certain kinds of food. °· You may have changes in your emotions from day to day, such as being excited to be pregnant or being concerned that something may go wrong with the pregnancy and  baby. °· You may have more vivid and strange dreams. °· You may have changes in your hair. These can include thickening of your hair, rapid growth, and changes in texture. Some women also have hair loss during or after pregnancy, or hair that feels dry or thin. Your hair will most likely return to normal after your baby is born. °WHAT TO EXPECT AT YOUR PRENATAL VISITS °During a routine prenatal visit: °· You will be weighed to make sure you and the baby are growing normally. °· Your blood pressure will be taken. °· Your abdomen will be measured to track your baby's growth. °· The fetal heartbeat will be listened to starting around week 10 or 12 of your pregnancy. °· Test results from any previous visits will be discussed. °Your health care provider may ask you: °· How you are feeling. °· If you are feeling the baby move. °· If you have had any abnormal symptoms, such as leaking fluid, bleeding, severe headaches, or abdominal cramping. °· If you are using any tobacco products, including cigarettes, chewing tobacco, and electronic cigarettes. °· If you have any questions. °Other tests that may be performed during your first trimester include: °· Blood tests to find your blood type and to check for   the presence of any previous infections. They will also be used to check for low iron levels (anemia) and Rh antibodies. Later in the pregnancy, blood tests for diabetes will be done along with other tests if problems develop. °· Urine tests to check for infections, diabetes, or protein in the urine. °· An ultrasound to confirm the proper growth and development of the baby. °· An amniocentesis to check for possible genetic problems. °· Fetal screens for spina bifida and Down syndrome. °· You may need other tests to make sure you and the baby are doing well. °· HIV (human immunodeficiency virus) testing. Routine prenatal testing includes screening for HIV, unless you choose not to have this test. °HOME CARE INSTRUCTIONS    °Medicines °· Follow your health care provider's instructions regarding medicine use. Specific medicines may be either safe or unsafe to take during pregnancy. °· Take your prenatal vitamins as directed. °· If you develop constipation, try taking a stool softener if your health care provider approves. °Diet °· Eat regular, well-balanced meals. Choose a variety of foods, such as meat or vegetable-based protein, fish, milk and low-fat dairy products, vegetables, fruits, and whole grain breads and cereals. Your health care provider will help you determine the amount of weight gain that is right for you. °· Avoid raw meat and uncooked cheese. These carry germs that can cause birth defects in the baby. °· Eating four or five small meals rather than three large meals a day may help relieve nausea and vomiting. If you start to feel nauseous, eating a few soda crackers can be helpful. Drinking liquids between meals instead of during meals also seems to help nausea and vomiting. °· If you develop constipation, eat more high-fiber foods, such as fresh vegetables or fruit and whole grains. Drink enough fluids to keep your urine clear or pale yellow. °Activity and Exercise °· Exercise only as directed by your health care provider. Exercising will help you: °¨ Control your weight. °¨ Stay in shape. °¨ Be prepared for labor and delivery. °· Experiencing pain or cramping in the lower abdomen or low back is a good sign that you should stop exercising. Check with your health care provider before continuing normal exercises. °· Try to avoid standing for long periods of time. Move your legs often if you must stand in one place for a long time. °· Avoid heavy lifting. °· Wear low-heeled shoes, and practice good posture. °· You may continue to have sex unless your health care provider directs you otherwise. °Relief of Pain or Discomfort °· Wear a good support bra for breast tenderness.   °· Take warm sitz baths to soothe any pain or  discomfort caused by hemorrhoids. Use hemorrhoid cream if your health care provider approves.   °· Rest with your legs elevated if you have leg cramps or low back pain. °· If you develop varicose veins in your legs, wear support hose. Elevate your feet for 15 minutes, 3-4 times a day. Limit salt in your diet. °Prenatal Care °· Schedule your prenatal visits by the twelfth week of pregnancy. They are usually scheduled monthly at first, then more often in the last 2 months before delivery. °· Write down your questions. Take them to your prenatal visits. °· Keep all your prenatal visits as directed by your health care provider. °Safety °· Wear your seat belt at all times when driving. °· Make a list of emergency phone numbers, including numbers for family, friends, the hospital, and police and fire departments. °General   Tips °· Ask your health care provider for a referral to a local prenatal education class. Begin classes no later than at the beginning of month 6 of your pregnancy. °· Ask for help if you have counseling or nutritional needs during pregnancy. Your health care provider can offer advice or refer you to specialists for help with various needs. °· Do not use hot tubs, steam rooms, or saunas. °· Do not douche or use tampons or scented sanitary pads. °· Do not cross your legs for long periods of time. °· Avoid cat litter boxes and soil used by cats. These carry germs that can cause birth defects in the baby and possibly loss of the fetus by miscarriage or stillbirth. °· Avoid all smoking, herbs, alcohol, and medicines not prescribed by your health care provider. Chemicals in these affect the formation and growth of the baby. °· Do not use any tobacco products, including cigarettes, chewing tobacco, and electronic cigarettes. If you need help quitting, ask your health care provider. You may receive counseling support and other resources to help you quit. °· Schedule a dentist appointment. At home, brush your  teeth with a soft toothbrush and be gentle when you floss. °SEEK MEDICAL CARE IF:  °· You have dizziness. °· You have mild pelvic cramps, pelvic pressure, or nagging pain in the abdominal area. °· You have persistent nausea, vomiting, or diarrhea. °· You have a bad smelling vaginal discharge. °· You have pain with urination. °· You notice increased swelling in your face, hands, legs, or ankles. °SEEK IMMEDIATE MEDICAL CARE IF:  °· You have a fever. °· You are leaking fluid from your vagina. °· You have spotting or bleeding from your vagina. °· You have severe abdominal cramping or pain. °· You have rapid weight gain or loss. °· You vomit blood or material that looks like coffee grounds. °· You are exposed to German measles and have never had them. °· You are exposed to fifth disease or chickenpox. °· You develop a severe headache. °· You have shortness of breath. °· You have any kind of trauma, such as from a fall or a car accident. °  °This information is not intended to replace advice given to you by your health care provider. Make sure you discuss any questions you have with your health care provider. °  °Document Released: 07/18/2001 Document Revised: 08/14/2014 Document Reviewed: 06/03/2013 °[employer]vier Interactive Patient Education ©2016 [employer] ° °
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
## 2016-02-07 LAB — GC/CHLAMYDIA PROBE AMP (~~LOC~~) NOT AT ARMC
Chlamydia: NEGATIVE
NEISSERIA GONORRHEA: NEGATIVE

## 2016-02-12 ENCOUNTER — Inpatient Hospital Stay (HOSPITAL_COMMUNITY)
Admission: AD | Admit: 2016-02-12 | Discharge: 2016-02-12 | Disposition: A | Discharge status: Home / Self Care | Payer: Medicaid Other | Source: Ambulatory Visit | Attending: Obstetrics and Gynecology | Admitting: Obstetrics and Gynecology

## 2016-02-12 ENCOUNTER — Inpatient Hospital Stay (HOSPITAL_COMMUNITY): Payer: Medicaid Other

## 2016-02-12 ENCOUNTER — Encounter (HOSPITAL_COMMUNITY): Payer: Self-pay | Admitting: *Deleted

## 2016-02-12 DIAGNOSIS — O4691 Antepartum hemorrhage, unspecified, first trimester: Secondary | ICD-10-CM

## 2016-02-12 DIAGNOSIS — N83201 Unspecified ovarian cyst, right side: Secondary | ICD-10-CM | POA: Diagnosis not present

## 2016-02-12 DIAGNOSIS — Z3A08 8 weeks gestation of pregnancy: Secondary | ICD-10-CM | POA: Diagnosis not present

## 2016-02-12 DIAGNOSIS — N83209 Unspecified ovarian cyst, unspecified side: Secondary | ICD-10-CM

## 2016-02-12 DIAGNOSIS — O209 Hemorrhage in early pregnancy, unspecified: Secondary | ICD-10-CM | POA: Diagnosis not present

## 2016-02-12 DIAGNOSIS — O3481 Maternal care for other abnormalities of pelvic organs, first trimester: Secondary | ICD-10-CM | POA: Insufficient documentation

## 2016-02-12 DIAGNOSIS — O26899 Other specified pregnancy related conditions, unspecified trimester: Secondary | ICD-10-CM

## 2016-02-12 DIAGNOSIS — R001 Bradycardia, unspecified: Secondary | ICD-10-CM | POA: Diagnosis not present

## 2016-02-12 DIAGNOSIS — R109 Unspecified abdominal pain: Secondary | ICD-10-CM

## 2016-02-12 DIAGNOSIS — Z87891 Personal history of nicotine dependence: Secondary | ICD-10-CM | POA: Diagnosis not present

## 2016-02-12 LAB — WET PREP, GENITAL
Clue Cells Wet Prep HPF POC: NONE SEEN
SPERM: NONE SEEN
Trich, Wet Prep: NONE SEEN
YEAST WET PREP: NONE SEEN

## 2016-02-12 LAB — CBC
HEMATOCRIT: 32.6 % — AB (ref 36.0–46.0)
HEMOGLOBIN: 10.7 g/dL — AB (ref 12.0–15.0)
MCH: 25.4 pg — AB (ref 26.0–34.0)
MCHC: 32.8 g/dL (ref 30.0–36.0)
MCV: 77.3 fL — ABNORMAL LOW (ref 78.0–100.0)
PLATELETS: 232 10*3/uL (ref 150–400)
RBC: 4.22 MIL/uL (ref 3.87–5.11)
RDW: 14.7 % (ref 11.5–15.5)
WBC: 6.3 10*3/uL (ref 4.0–10.5)

## 2016-02-12 LAB — URINE MICROSCOPIC-ADD ON

## 2016-02-12 LAB — URINALYSIS, ROUTINE W REFLEX MICROSCOPIC
Bilirubin Urine: NEGATIVE
GLUCOSE, UA: 100 mg/dL — AB
Ketones, ur: NEGATIVE mg/dL
Nitrite: NEGATIVE
PROTEIN: NEGATIVE mg/dL
SPECIFIC GRAVITY, URINE: 1.015 (ref 1.005–1.030)
pH: 6.5 (ref 5.0–8.0)

## 2016-02-12 MED ORDER — SODIUM CHLORIDE 0.9 % IV SOLN: 25.0000 mg | Freq: Once | INTRAVENOUS | Status: DC

## 2016-02-12 MED ORDER — PROMETHAZINE HCL 25 MG/ML IJ SOLN
25.0000 mg | Freq: Once | INTRAVENOUS | Status: AC
  Administered 2016-02-12: 25 mg via INTRAVENOUS
  Filled 2016-02-12: qty 1

## 2016-02-12 MED ORDER — GLYCOPYRROLATE 0.2 MG/ML IJ SOLN
0.2000 mg | Freq: Once | INTRAMUSCULAR | Status: AC
  Administered 2016-02-12: 0.2 mg via INTRAVENOUS
  Filled 2016-02-12: qty 1

## 2016-02-12 MED ORDER — PROMETHAZINE HCL 25 MG PO TABS: 25.0000 mg | ORAL_TABLET | Freq: Four times a day (QID) | ORAL | Status: DC | PRN

## 2016-02-12 NOTE — Discharge Instructions (Signed)
Abdominal Pain During Pregnancy Abdominal pain is common in pregnancy. Most of the time, it does not cause harm. There are many causes of abdominal pain. Some causes are more serious than others. Some of the causes of abdominal pain in pregnancy are easily diagnosed. Occasionally, the diagnosis takes time to understand. Other times, the cause is not determined. Abdominal pain can be a sign that something is very wrong with the pregnancy, or the pain may have nothing to do with the pregnancy at all. For this reason, always tell your health care provider if you have any abdominal discomfort. HOME CARE INSTRUCTIONS  Monitor your abdominal pain for any changes. The following actions may help to alleviate any discomfort you are experiencing:  Do not have sexual intercourse or put anything in your vagina until your symptoms go away completely.  Get plenty of rest until your pain improves.  Drink clear fluids if you feel nauseous. Avoid solid food as long as you are uncomfortable or nauseous.  Only take over-the-counter or prescription medicine as directed by your health care provider.  Keep all follow-up appointments with your health care provider. SEEK IMMEDIATE MEDICAL CARE IF:  You are bleeding, leaking fluid, or passing tissue from the vagina.  You have increasing pain or cramping.  You have persistent vomiting.  You have painful or bloody urination.  You have a fever.  You notice a decrease in your baby's movements.  You have extreme weakness or feel faint.  You have shortness of breath, with or without abdominal pain.  You develop a severe headache with abdominal pain.  You have abnormal vaginal discharge with abdominal pain.  You have persistent diarrhea.  You have abdominal pain that continues even after rest, or gets worse. MAKE SURE YOU:   Understand these instructions.  Will watch your condition.  Will get help right away if you are not doing well or get worse.     This information is not intended to replace advice given to you by your health care provider. Make sure you discuss any questions you have with your health care provider.   Document Released: 07/24/2005 Document Revised: 05/14/2013 Document Reviewed: 02/20/2013 Elsevier Interactive Patient Education 2016 [employer]  Vaginal Bleeding During Pregnancy, First Trimester A small amount of bleeding (spotting) from the vagina is relatively common in early pregnancy. It usually stops on its own. Various things may cause bleeding or spotting in early pregnancy. Some bleeding may be related to the pregnancy, and some may not. In most cases, the bleeding is normal and is not a problem. However, bleeding can also be a sign of something serious. Be sure to tell your health care provider about any vaginal bleeding right away. Some possible causes of vaginal bleeding during the first trimester include:  Infection or inflammation of the cervix.  Growths (polyps) on the cervix.  Miscarriage or threatened miscarriage.  Pregnancy tissue has developed outside of the uterus and in a fallopian tube (tubal pregnancy).  Tiny cysts have developed in the uterus instead of pregnancy tissue (molar pregnancy). HOME CARE INSTRUCTIONS  Watch your condition for any changes. The following actions may help to lessen any discomfort you are feeling:  Follow your health care provider's instructions for limiting your activity. If your health care provider orders bed rest, you may need to stay in bed and only get up to use the bathroom. However, your health care provider may allow you to continue light activity.  If needed, make plans for someone to help  with your regular activities and responsibilities while you are on bed rest.  Keep track of the number of pads you use each day, how often you change pads, and how soaked (saturated) they are. Write this down.  Do not use tampons. Do not douche.  Do not have sexual  intercourse or orgasms until approved by your health care provider.  If you pass any tissue from your vagina, save the tissue so you can show it to your health care provider.  Only take over-the-counter or prescription medicines as directed by your health care provider.  Do not take aspirin because it can make you bleed.  Keep all follow-up appointments as directed by your health care provider. SEEK MEDICAL CARE IF:  You have any vaginal bleeding during any part of your pregnancy.  You have cramps or labor pains.  You have a fever, not controlled by medicine. SEEK IMMEDIATE MEDICAL CARE IF:   You have severe cramps in your back or belly (abdomen).  You pass large clots or tissue from your vagina.  Your bleeding increases.  You feel light-headed or weak, or you have fainting episodes.  You have chills.  You are leaking fluid or have a gush of fluid from your vagina.  You pass out while having a bowel movement. MAKE SURE YOU:  Understand these instructions.  Will watch your condition.  Will get help right away if you are not doing well or get worse.   This information is not intended to replace advice given to you by your health care provider. Make sure you discuss any questions you have with your health care provider.   Document Released: 05/03/2005 Document Revised: 07/29/2013 Document Reviewed: 03/31/2013 Elsevier Interactive Patient Education 2016 [employer] Inc.  Pelvic Rest Pelvic rest is sometimes recommended for women when:   The placenta is partially or completely covering the opening of the cervix (placenta previa).  There is bleeding between the uterine wall and the amniotic sac in the first trimester (subchorionic hemorrhage).  The cervix begins to open without labor starting (incompetent cervix, cervical insufficiency).  The labor is too early (preterm labor). HOME CARE INSTRUCTIONS  Do not have sexual intercourse, stimulation, or an orgasm.  Do not  use tampons, douche, or put anything in the vagina.  Do not lift anything over 10 pounds (4.5 kg).  Avoid strenuous activity or straining your pelvic muscles. SEEK MEDICAL CARE IF:  You have any vaginal bleeding during pregnancy. Treat this as a potential emergency.  You have cramping pain felt low in the stomach (stronger than menstrual cramps).  You notice vaginal discharge (watery, mucus, or bloody).  You have a low, dull backache.  There are regular contractions or uterine tightening. SEEK IMMEDIATE MEDICAL CARE IF: You have vaginal bleeding and have placenta previa.    This information is not intended to replace advice given to you by your health care provider. Make sure you discuss any questions you have with your health care provider.   Document Released: 11/18/2010 Document Revised: 10/16/2011 Document Reviewed: 01/25/2015 Elsevier Interactive Patient Education [employer]2016 [employer]
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
## 2016-02-12 NOTE — MAU Note (Addendum)
Pt arrived via EMS.  Pt states she was having vaginal bleeding that started last night around 2030 and ended this morning around 0900 this morning.  Pt states the bleeding started light and got worse as the night went on.  Pt states she is not bleeding now but is having lower and upper abdominal cramping and back cramping.

## 2016-02-13 LAB — CULTURE, OB URINE: SPECIAL REQUESTS: NORMAL

## 2016-02-14 ENCOUNTER — Encounter: Payer: Self-pay | Admitting: Obstetrics & Gynecology

## 2016-02-14 ENCOUNTER — Ambulatory Visit (INDEPENDENT_AMBULATORY_CARE_PROVIDER_SITE_OTHER): Payer: Medicaid Other | Admitting: Obstetrics & Gynecology

## 2016-02-14 VITALS — BP 91/68 | HR 74 | Wt 185.0 lb

## 2016-02-14 DIAGNOSIS — Z3491 Encounter for supervision of normal pregnancy, unspecified, first trimester: Secondary | ICD-10-CM

## 2016-02-14 LAB — GC/CHLAMYDIA PROBE AMP (~~LOC~~) NOT AT ARMC
Chlamydia: NEGATIVE
NEISSERIA GONORRHEA: NEGATIVE

## 2016-02-14 NOTE — Patient Instructions (Signed)
 First Trimester of Pregnancy The first trimester of pregnancy is from week 1 until the end of week 12 (months 1 through 3). A week after a sperm fertilizes an egg, the egg will implant on the wall of the uterus. This embryo will begin to develop into a baby. Genes from you and your partner are forming the baby. The female genes determine whether the baby is a boy or a girl. At 6-8 weeks, the eyes and face are formed, and the heartbeat can be seen on ultrasound. At the end of 12 weeks, all the baby's organs are formed.  Now that you are pregnant, you will want to do everything you can to have a healthy baby. Two of the most important things are to get good prenatal care and to follow your health care provider's instructions. Prenatal care is all the medical care you receive before the baby's birth. This care will help prevent, find, and treat any problems during the pregnancy and childbirth. BODY CHANGES Your body goes through many changes during pregnancy. The changes vary from woman to woman.   You may gain or lose a couple of pounds at first.  You may feel sick to your stomach (nauseous) and throw up (vomit). If the vomiting is uncontrollable, call your health care provider.  You may tire easily.  You may develop headaches that can be relieved by medicines approved by your health care provider.  You may urinate more often. Painful urination may mean you have a bladder infection.  You may develop heartburn as a result of your pregnancy.  You may develop constipation because certain hormones are causing the muscles that push waste through your intestines to slow down.  You may develop hemorrhoids or swollen, bulging veins (varicose veins).  Your breasts may begin to grow larger and become tender. Your nipples may stick out more, and the tissue that surrounds them (areola) may become darker.  Your gums may bleed and may be sensitive to brushing and flossing.  Dark spots or blotches  (chloasma, mask of pregnancy) may develop on your face. This will likely fade after the baby is born.  Your menstrual periods will stop.  You may have a loss of appetite.  You may develop cravings for certain kinds of food.  You may have changes in your emotions from day to day, such as being excited to be pregnant or being concerned that something may go wrong with the pregnancy and baby.  You may have more vivid and strange dreams.  You may have changes in your hair. These can include thickening of your hair, rapid growth, and changes in texture. Some women also have hair loss during or after pregnancy, or hair that feels dry or thin. Your hair will most likely return to normal after your baby is born. WHAT TO EXPECT AT YOUR PRENATAL VISITS During a routine prenatal visit:  You will be weighed to make sure you and the baby are growing normally.  Your blood pressure will be taken.  Your abdomen will be measured to track your baby's growth.  The fetal heartbeat will be listened to starting around week 10 or 12 of your pregnancy.  Test results from any previous visits will be discussed. Your health care provider may ask you:  How you are feeling.  If you are feeling the baby move.  If you have had any abnormal symptoms, such as leaking fluid, bleeding, severe headaches, or abdominal cramping.  If you are using any tobacco   products, including cigarettes, chewing tobacco, and electronic cigarettes.  If you have any questions. Other tests that may be performed during your first trimester include:  Blood tests to find your blood type and to check for the presence of any previous infections. They will also be used to check for low iron levels (anemia) and Rh antibodies. Later in the pregnancy, blood tests for diabetes will be done along with other tests if problems develop.  Urine tests to check for infections, diabetes, or protein in the urine.  An ultrasound to confirm the  proper growth and development of the baby.  An amniocentesis to check for possible genetic problems.  Fetal screens for spina bifida and Down syndrome.  You may need other tests to make sure you and the baby are doing well.  HIV (human immunodeficiency virus) testing. Routine prenatal testing includes screening for HIV, unless you choose not to have this test. HOME CARE INSTRUCTIONS  Medicines  Follow your health care provider's instructions regarding medicine use. Specific medicines may be either safe or unsafe to take during pregnancy.  Take your prenatal vitamins as directed.  If you develop constipation, try taking a stool softener if your health care provider approves. Diet  Eat regular, well-balanced meals. Choose a variety of foods, such as meat or vegetable-based protein, fish, milk and low-fat dairy products, vegetables, fruits, and whole grain breads and cereals. Your health care provider will help you determine the amount of weight gain that is right for you.  Avoid raw meat and uncooked cheese. These carry germs that can cause birth defects in the baby.  Eating four or five small meals rather than three large meals a day may help relieve nausea and vomiting. If you start to feel nauseous, eating a few soda crackers can be helpful. Drinking liquids between meals instead of during meals also seems to help nausea and vomiting.  If you develop constipation, eat more high-fiber foods, such as fresh vegetables or fruit and whole grains. Drink enough fluids to keep your urine clear or pale yellow. Activity and Exercise  Exercise only as directed by your health care provider. Exercising will help you:  Control your weight.  Stay in shape.  Be prepared for labor and delivery.  Experiencing pain or cramping in the lower abdomen or low back is a good sign that you should stop exercising. Check with your health care provider before continuing normal exercises.  Try to avoid  standing for long periods of time. Move your legs often if you must stand in one place for a long time.  Avoid heavy lifting.  Wear low-heeled shoes, and practice good posture.  You may continue to have sex unless your health care provider directs you otherwise. Relief of Pain or Discomfort  Wear a good support bra for breast tenderness.   Take warm sitz baths to soothe any pain or discomfort caused by hemorrhoids. Use hemorrhoid cream if your health care provider approves.   Rest with your legs elevated if you have leg cramps or low back pain.  If you develop varicose veins in your legs, wear support hose. Elevate your feet for 15 minutes, 3-4 times a day. Limit salt in your diet. Prenatal Care  Schedule your prenatal visits by the twelfth week of pregnancy. They are usually scheduled monthly at first, then more often in the last 2 months before delivery.  Write down your questions. Take them to your prenatal visits.  Keep all your prenatal visits as directed by   your health care provider. Safety  Wear your seat belt at all times when driving.  Make a list of emergency phone numbers, including numbers for family, friends, the hospital, and police and fire departments. General Tips  Ask your health care provider for a referral to a local prenatal education class. Begin classes no later than at the beginning of month 6 of your pregnancy.  Ask for help if you have counseling or nutritional needs during pregnancy. Your health care provider can offer advice or refer you to specialists for help with various needs.  Do not use hot tubs, steam rooms, or saunas.  Do not douche or use tampons or scented sanitary pads.  Do not cross your legs for long periods of time.  Avoid cat litter boxes and soil used by cats. These carry germs that can cause birth defects in the baby and possibly loss of the fetus by miscarriage or stillbirth.  Avoid all smoking, herbs, alcohol, and medicines  not prescribed by your health care provider. Chemicals in these affect the formation and growth of the baby.  Do not use any tobacco products, including cigarettes, chewing tobacco, and electronic cigarettes. If you need help quitting, ask your health care provider. You may receive counseling support and other resources to help you quit.  Schedule a dentist appointment. At home, brush your teeth with a soft toothbrush and be gentle when you floss. SEEK MEDICAL CARE IF:   You have dizziness.  You have mild pelvic cramps, pelvic pressure, or nagging pain in the abdominal area.  You have persistent nausea, vomiting, or diarrhea.  You have a bad smelling vaginal discharge.  You have pain with urination.  You notice increased swelling in your face, hands, legs, or ankles. SEEK IMMEDIATE MEDICAL CARE IF:   You have a fever.  You are leaking fluid from your vagina.  You have spotting or bleeding from your vagina.  You have severe abdominal cramping or pain.  You have rapid weight gain or loss.  You vomit blood or material that looks like coffee grounds.  You are exposed to German measles and have never had them.  You are exposed to fifth disease or chickenpox.  You develop a severe headache.  You have shortness of breath.  You have any kind of trauma, such as from a fall or a car accident.   This information is not intended to replace advice given to you by your health care provider. Make sure you discuss any questions you have with your health care provider.   Document Released: 07/18/2001 Document Revised: 08/14/2014 Document Reviewed: 06/03/2013 [employer] Interactive Patient Education 2016 [employer]   Breastfeeding Deciding to breastfeed is one of the best choices you can make for you and your baby. A change in hormones during pregnancy causes your breast tissue to grow and increases the number and size of your milk ducts. These hormones also allow proteins, sugars,  and fats from your blood supply to make breast milk in your milk-producing glands. Hormones prevent breast milk from being released before your baby is born as well as prompt milk flow after birth. Once breastfeeding has begun, thoughts of your baby, as well as his or her sucking or crying, can stimulate the release of milk from your milk-producing glands.  BENEFITS OF BREASTFEEDING For Your Baby  Your first milk (colostrum) helps your baby's digestive system function better.  There are antibodies in your milk that help your baby fight off infections.  Your baby has   a lower incidence of asthma, allergies, and sudden infant death syndrome.  The nutrients in breast milk are better for your baby than infant formulas and are designed uniquely for your baby's needs.  Breast milk improves your baby's brain development.  Your baby is less likely to develop other conditions, such as childhood obesity, asthma, or type 2 diabetes mellitus. For You  Breastfeeding helps to create a very special bond between you and your baby.  Breastfeeding is convenient. Breast milk is always available at the correct temperature and costs nothing.  Breastfeeding helps to burn calories and helps you lose the weight gained during pregnancy.  Breastfeeding makes your uterus contract to its prepregnancy size faster and slows bleeding (lochia) after you give birth.   Breastfeeding helps to lower your risk of developing type 2 diabetes mellitus, osteoporosis, and breast or ovarian cancer later in life. SIGNS THAT YOUR BABY IS HUNGRY Early Signs of Hunger  Increased alertness or activity.  Stretching.  Movement of the head from side to side.  Movement of the head and opening of the mouth when the corner of the mouth or cheek is stroked (rooting).  Increased sucking sounds, smacking lips, cooing, sighing, or squeaking.  Hand-to-mouth movements.  Increased sucking of fingers or hands. Late Signs of  Hunger  Fussing.  Intermittent crying. Extreme Signs of Hunger Signs of extreme hunger will require calming and consoling before your baby will be able to breastfeed successfully. Do not wait for the following signs of extreme hunger to occur before you initiate breastfeeding:  Restlessness.  A loud, strong cry.  Screaming. BREASTFEEDING BASICS Breastfeeding Initiation  Find a comfortable place to sit or lie down, with your neck and back well supported.  Place a pillow or rolled up blanket under your baby to bring him or her to the level of your breast (if you are seated). Nursing pillows are specially designed to help support your arms and your baby while you breastfeed.  Make sure that your baby's abdomen is facing your abdomen.  Gently massage your breast. With your fingertips, massage from your chest wall toward your nipple in a circular motion. This encourages milk flow. You may need to continue this action during the feeding if your milk flows slowly.  Support your breast with 4 fingers underneath and your thumb above your nipple. Make sure your fingers are well away from your nipple and your baby's mouth.  Stroke your baby's lips gently with your finger or nipple.  When your baby's mouth is open wide enough, quickly bring your baby to your breast, placing your entire nipple and as much of the colored area around your nipple (areola) as possible into your baby's mouth.  More areola should be visible above your baby's upper lip than below the lower lip.  Your baby's tongue should be between his or her lower gum and your breast.  Ensure that your baby's mouth is correctly positioned around your nipple (latched). Your baby's lips should create a seal on your breast and be turned out (everted).  It is common for your baby to suck about 2-3 minutes in order to start the flow of breast milk. Latching Teaching your baby how to latch on to your breast properly is very important.  An improper latch can cause nipple pain and decreased milk supply for you and poor weight gain in your baby. Also, if your baby is not latched onto your nipple properly, he or she may swallow some air during feeding. This   can make your baby fussy. Burping your baby when you switch breasts during the feeding can help to get rid of the air. However, teaching your baby to latch on properly is still the best way to prevent fussiness from swallowing air while breastfeeding. Signs that your baby has successfully latched on to your nipple:  Silent tugging or silent sucking, without causing you pain.  Swallowing heard between every 3-4 sucks.  Muscle movement above and in front of his or her ears while sucking. Signs that your baby has not successfully latched on to nipple:  Sucking sounds or smacking sounds from your baby while breastfeeding.  Nipple pain. If you think your baby has not latched on correctly, slip your finger into the corner of your baby's mouth to break the suction and place it between your baby's gums. Attempt breastfeeding initiation again. Signs of Successful Breastfeeding Signs from your baby:  A gradual decrease in the number of sucks or complete cessation of sucking.  Falling asleep.  Relaxation of his or her body.  Retention of a small amount of milk in his or her mouth.  Letting go of your breast by himself or herself. Signs from you:  Breasts that have increased in firmness, weight, and size 1-3 hours after feeding.  Breasts that are softer immediately after breastfeeding.  Increased milk volume, as well as a change in milk consistency and color by the fifth day of breastfeeding.  Nipples that are not sore, cracked, or bleeding. Signs That Your Baby is Getting Enough Milk  Wetting at least 3 diapers in a 24-hour period. The urine should be clear and pale yellow by age 5 days.  At least 3 stools in a 24-hour period by age 5 days. The stool should be soft and  yellow.  At least 3 stools in a 24-hour period by age 7 days. The stool should be seedy and yellow.  No loss of weight greater than 10% of birth weight during the first 3 days of age.  Average weight gain of 4-7 ounces (113-198 g) per week after age 4 days.  Consistent daily weight gain by age 5 days, without weight loss after the age of 2 weeks. After a feeding, your baby may spit up a small amount. This is common. BREASTFEEDING FREQUENCY AND DURATION Frequent feeding will help you make more milk and can prevent sore nipples and breast engorgement. Breastfeed when you feel the need to reduce the fullness of your breasts or when your baby shows signs of hunger. This is called "breastfeeding on demand." Avoid introducing a pacifier to your baby while you are working to establish breastfeeding (the first 4-6 weeks after your baby is born). After this time you may choose to use a pacifier. Research has shown that pacifier use during the first year of a baby's life decreases the risk of sudden infant death syndrome (SIDS). Allow your baby to feed on each breast as long as he or she wants. Breastfeed until your baby is finished feeding. When your baby unlatches or falls asleep while feeding from the first breast, offer the second breast. Because newborns are often sleepy in the first few weeks of life, you may need to awaken your baby to get him or her to feed. Breastfeeding times will vary from baby to baby. However, the following rules can serve as a guide to help you ensure that your baby is properly fed:  Newborns (babies 4 weeks of age or younger) may breastfeed every 1-3 hours.    Newborns should not go longer than 3 hours during the day or 5 hours during the night without breastfeeding.  You should breastfeed your baby a minimum of 8 times in a 24-hour period until you begin to introduce solid foods to your baby at around 6 months of age. BREAST MILK PUMPING Pumping and storing breast milk  allows you to ensure that your baby is exclusively fed your breast milk, even at times when you are unable to breastfeed. This is especially important if you are going back to work while you are still breastfeeding or when you are not able to be present during feedings. Your lactation consultant can give you guidelines on how long it is safe to store breast milk. A breast pump is a machine that allows you to pump milk from your breast into a sterile bottle. The pumped breast milk can then be stored in a refrigerator or freezer. Some breast pumps are operated by hand, while others use electricity. Ask your lactation consultant which type will work best for you. Breast pumps can be purchased, but some hospitals and breastfeeding support groups lease breast pumps on a monthly basis. A lactation consultant can teach you how to hand express breast milk, if you prefer not to use a pump. CARING FOR YOUR BREASTS WHILE YOU BREASTFEED Nipples can become dry, cracked, and sore while breastfeeding. The following recommendations can help keep your breasts moisturized and healthy:  Avoid using soap on your nipples.  Wear a supportive bra. Although not required, special nursing bras and tank tops are designed to allow access to your breasts for breastfeeding without taking off your entire bra or top. Avoid wearing underwire-style bras or extremely tight bras.  Air dry your nipples for 3-4minutes after each feeding.  Use only cotton bra pads to absorb leaked breast milk. Leaking of breast milk between feedings is normal.  Use lanolin on your nipples after breastfeeding. Lanolin helps to maintain your skin's normal moisture barrier. If you use pure lanolin, you do not need to wash it off before feeding your baby again. Pure lanolin is not toxic to your baby. You may also hand express a few drops of breast milk and gently massage that milk into your nipples and allow the milk to air dry. In the first few weeks after  giving birth, some women experience extremely full breasts (engorgement). Engorgement can make your breasts feel heavy, warm, and tender to the touch. Engorgement peaks within 3-5 days after you give birth. The following recommendations can help ease engorgement:  Completely empty your breasts while breastfeeding or pumping. You may want to start by applying warm, moist heat (in the shower or with warm water-soaked hand towels) just before feeding or pumping. This increases circulation and helps the milk flow. If your baby does not completely empty your breasts while breastfeeding, pump any extra milk after he or she is finished.  Wear a snug bra (nursing or regular) or tank top for 1-2 days to signal your body to slightly decrease milk production.  Apply ice packs to your breasts, unless this is too uncomfortable for you.  Make sure that your baby is latched on and positioned properly while breastfeeding. If engorgement persists after 48 hours of following these recommendations, contact your health care provider or a lactation consultant. OVERALL HEALTH CARE RECOMMENDATIONS WHILE BREASTFEEDING  Eat healthy foods. Alternate between meals and snacks, eating 3 of each per day. Because what you eat affects your breast milk, some of the foods   may make your baby more irritable than usual. Avoid eating these foods if you are sure that they are negatively affecting your baby.  Drink milk, fruit juice, and water to satisfy your thirst (about 10 glasses a day).  Rest often, relax, and continue to take your prenatal vitamins to prevent fatigue, stress, and anemia.  Continue breast self-awareness checks.  Avoid chewing and smoking tobacco. Chemicals from cigarettes that pass into breast milk and exposure to secondhand smoke may harm your baby.  Avoid alcohol and drug use, including marijuana. Some medicines that may be harmful to your baby can pass through breast milk. It is important to ask your health  care provider before taking any medicine, including all over-the-counter and prescription medicine as well as vitamin and herbal supplements. It is possible to become pregnant while breastfeeding. If birth control is desired, ask your health care provider about options that will be safe for your baby. SEEK MEDICAL CARE IF:  You feel like you want to stop breastfeeding or have become frustrated with breastfeeding.  You have painful breasts or nipples.  Your nipples are cracked or bleeding.  Your breasts are red, tender, or warm.  You have a swollen area on either breast.  You have a fever or chills.  You have nausea or vomiting.  You have drainage other than breast milk from your nipples.  Your breasts do not become full before feedings by the fifth day after you give birth.  You feel sad and depressed.  Your baby is too sleepy to eat well.  Your baby is having trouble sleeping.   Your baby is wetting less than 3 diapers in a 24-hour period.  Your baby has less than 3 stools in a 24-hour period.  Your baby's skin or the white part of his or her eyes becomes yellow.   Your baby is not gaining weight by 5 days of age. SEEK IMMEDIATE MEDICAL CARE IF:  Your baby is overly tired (lethargic) and does not want to wake up and feed.  Your baby develops an unexplained fever.   This information is not intended to replace advice given to you by your health care provider. Make sure you discuss any questions you have with your health care provider.   Document Released: 07/24/2005 Document Revised: 04/14/2015 Document Reviewed: 01/15/2013 [employer] Interactive Patient Education 2016 [employer]  
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
## 2016-02-15 ENCOUNTER — Telehealth: Payer: Self-pay | Admitting: Internal Medicine

## 2016-02-15 NOTE — Telephone Encounter (Signed)
Pt will come to [facility] @4 :00 on 02/15/16 [facility] needs to collect pt's urine again due a [facility] accident/ Urine leaked in transit.  [name]

## 2016-02-16 LAB — PRENATAL PROFILE I(LABCORP)
BASOS: 0 %
Basophils Absolute: 0 10*3/uL (ref 0.0–0.2)
EOS (ABSOLUTE): 0.1 10*3/uL (ref 0.0–0.4)
EOS: 1 %
Hematocrit: 35.9 % (ref 34.0–46.6)
Hemoglobin: 11.4 g/dL (ref 11.1–15.9)
Hepatitis B Surface Ag: NEGATIVE
IMMATURE GRANS (ABS): 0 10*3/uL (ref 0.0–0.1)
Immature Granulocytes: 0 %
Lymphocytes Absolute: 2.4 10*3/uL (ref 0.7–3.1)
Lymphs: 43 %
MCH: 25.1 pg — AB (ref 26.6–33.0)
MCHC: 31.8 g/dL (ref 31.5–35.7)
MCV: 79 fL (ref 79–97)
MONOS ABS: 0.4 10*3/uL (ref 0.1–0.9)
Monocytes: 7 %
NEUTROS PCT: 49 %
Neutrophils Absolute: 2.7 10*3/uL (ref 1.4–7.0)
Platelets: 242 10*3/uL (ref 150–379)
RBC: 4.54 x10E6/uL (ref 3.77–5.28)
RDW: 16 % — ABNORMAL HIGH (ref 12.3–15.4)
RPR: NONREACTIVE
Rh Factor: POSITIVE
Rubella Antibodies, IGG: 1.95 index (ref >0.99)
WBC: 5.5 10*3/uL (ref 3.4–10.8)

## 2016-02-16 LAB — HIV ANTIBODY (ROUTINE TESTING W REFLEX): HIV SCREEN 4TH GENERATION: NONREACTIVE

## 2016-02-16 LAB — HEMOGLOBINOPATHY EVALUATION
HEMOGLOBIN F QUANTITATION: 0 % (ref 0.0–2.0)
HGB C: 0 %
HGB S: 0 %
Hemoglobin A2 Quantitation: 2.3 % (ref 0.7–3.1)
Hgb A: 97.7 % (ref 94.0–98.0)

## 2016-02-16 LAB — IGP, APTIMA HPV, RFX 16/18,45
HPV APTIMA: NEGATIVE
PAP Smear Comment: 0

## 2016-02-16 LAB — URINE CULTURE

## 2016-02-16 LAB — AB SCR+ANTIBODY ID: Antibody Screen: POSITIVE — AB

## 2016-02-17 LAB — GC/CHLAMYDIA PROBE AMP
CHLAMYDIA, DNA PROBE: NEGATIVE
NEISSERIA GONORRHOEAE BY PCR: NEGATIVE

## 2016-02-19 ENCOUNTER — Encounter (HOSPITAL_COMMUNITY): Payer: Self-pay | Admitting: *Deleted

## 2016-02-19 ENCOUNTER — Inpatient Hospital Stay (HOSPITAL_COMMUNITY)
Admission: AD | Admit: 2016-02-19 | Discharge: 2016-02-19 | Disposition: A | Discharge status: Home / Self Care | Payer: Medicaid Other | Source: Ambulatory Visit | Attending: Obstetrics & Gynecology | Admitting: Obstetrics & Gynecology

## 2016-02-19 DIAGNOSIS — R109 Unspecified abdominal pain: Secondary | ICD-10-CM | POA: Diagnosis not present

## 2016-02-19 DIAGNOSIS — O209 Hemorrhage in early pregnancy, unspecified: Secondary | ICD-10-CM | POA: Diagnosis not present

## 2016-02-19 DIAGNOSIS — Z87891 Personal history of nicotine dependence: Secondary | ICD-10-CM | POA: Diagnosis not present

## 2016-02-19 DIAGNOSIS — Z8249 Family history of ischemic heart disease and other diseases of the circulatory system: Secondary | ICD-10-CM | POA: Diagnosis not present

## 2016-02-19 DIAGNOSIS — O26891 Other specified pregnancy related conditions, first trimester: Secondary | ICD-10-CM | POA: Insufficient documentation

## 2016-02-19 DIAGNOSIS — Z3A08 8 weeks gestation of pregnancy: Secondary | ICD-10-CM | POA: Diagnosis not present

## 2016-02-19 DIAGNOSIS — O4691 Antepartum hemorrhage, unspecified, first trimester: Secondary | ICD-10-CM | POA: Diagnosis not present

## 2016-02-19 DIAGNOSIS — Z833 Family history of diabetes mellitus: Secondary | ICD-10-CM | POA: Insufficient documentation

## 2016-02-19 DIAGNOSIS — N939 Abnormal uterine and vaginal bleeding, unspecified: Secondary | ICD-10-CM | POA: Diagnosis present

## 2016-02-19 LAB — URINALYSIS, ROUTINE W REFLEX MICROSCOPIC
BILIRUBIN URINE: NEGATIVE
GLUCOSE, UA: NEGATIVE mg/dL
Ketones, ur: NEGATIVE mg/dL
Leukocytes, UA: NEGATIVE
Nitrite: NEGATIVE
PROTEIN: NEGATIVE mg/dL
Specific Gravity, Urine: 1.01 (ref 1.005–1.030)
pH: 7 (ref 5.0–8.0)

## 2016-02-19 LAB — URINE MICROSCOPIC-ADD ON
Bacteria, UA: NONE SEEN
WBC, UA: NONE SEEN WBC/hpf (ref 0–5)

## 2016-02-19 MED ORDER — PROMETHAZINE HCL 25 MG RE SUPP: 25.0000 mg | Freq: Four times a day (QID) | RECTAL | Status: DC | PRN

## 2016-02-19 MED ORDER — PRENATA 29-1 MG PO CHEW: 1.0000 | CHEWABLE_TABLET | Freq: Every day | ORAL | Status: DC

## 2016-02-19 NOTE — MAU Note (Signed)
[facility]  Pt states she is having vaginal bleeding and abdominal cramping that started are drops last night and has been heavier this morning.

## 2016-02-19 NOTE — Discharge Instructions (Signed)
Keep your scheduled appointments. Pick up your medication and use as directed. Take Tylenol 325 mg 2 tablets by mouth every 4 hours if needed for pain. Drink at least 8 8-oz glasses of water every day.
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
## 2016-02-28 ENCOUNTER — Encounter (HOSPITAL_COMMUNITY): Payer: Self-pay | Admitting: *Deleted

## 2016-02-28 ENCOUNTER — Inpatient Hospital Stay (HOSPITAL_COMMUNITY)
Admission: AD | Admit: 2016-02-28 | Discharge: 2016-02-28 | Disposition: A | Discharge status: Home / Self Care | Payer: Medicaid Other | Source: Ambulatory Visit | Attending: Family Medicine | Admitting: Family Medicine

## 2016-02-28 DIAGNOSIS — K117 Disturbances of salivary secretion: Secondary | ICD-10-CM | POA: Insufficient documentation

## 2016-02-28 DIAGNOSIS — Z3A10 10 weeks gestation of pregnancy: Secondary | ICD-10-CM | POA: Diagnosis not present

## 2016-02-28 DIAGNOSIS — O219 Vomiting of pregnancy, unspecified: Secondary | ICD-10-CM | POA: Insufficient documentation

## 2016-02-28 DIAGNOSIS — O26891 Other specified pregnancy related conditions, first trimester: Secondary | ICD-10-CM | POA: Diagnosis not present

## 2016-02-28 DIAGNOSIS — O99611 Diseases of the digestive system complicating pregnancy, first trimester: Secondary | ICD-10-CM | POA: Insufficient documentation

## 2016-02-28 DIAGNOSIS — R112 Nausea with vomiting, unspecified: Secondary | ICD-10-CM | POA: Diagnosis present

## 2016-02-28 DIAGNOSIS — Z87891 Personal history of nicotine dependence: Secondary | ICD-10-CM | POA: Insufficient documentation

## 2016-02-28 LAB — URINE MICROSCOPIC-ADD ON

## 2016-02-28 LAB — URINALYSIS, ROUTINE W REFLEX MICROSCOPIC
Bilirubin Urine: NEGATIVE
GLUCOSE, UA: NEGATIVE mg/dL
Ketones, ur: NEGATIVE mg/dL
Nitrite: NEGATIVE
PH: 5.5 (ref 5.0–8.0)
Protein, ur: NEGATIVE mg/dL

## 2016-02-28 MED ORDER — PROMETHAZINE HCL 25 MG/ML IJ SOLN
25.0000 mg | Freq: Once | INTRAMUSCULAR | Status: AC
  Administered 2016-02-28: 25 mg via INTRAMUSCULAR
  Filled 2016-02-28: qty 1

## 2016-02-28 MED ORDER — GLYCOPYRROLATE 0.2 MG/ML IJ SOLN
0.1000 mg | Freq: Once | INTRAMUSCULAR | Status: AC
  Administered 2016-02-28: 0.1 mg via INTRAMUSCULAR
  Filled 2016-02-28: qty 0.5

## 2016-02-28 MED ORDER — GLYCOPYRROLATE 1 MG PO TABS: 1.0000 mg | ORAL_TABLET | Freq: Three times a day (TID) | ORAL | 0 refills | Status: DC

## 2016-02-28 MED ORDER — PROMETHAZINE HCL 12.5 MG PO TABS: 12.5000 mg | ORAL_TABLET | Freq: Four times a day (QID) | ORAL | 0 refills | Status: DC | PRN

## 2016-02-28 NOTE — MAU Note (Signed)
Patient arrived by EMS for nausea and vomiting.  She was seen in MAU two weeks ago and given medications for nausea.  Patient states they are no longer working.  Unsure of medication.

## 2016-02-28 NOTE — Discharge Instructions (Signed)
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
## 2016-03-07 DIAGNOSIS — R55 Syncope and collapse: Secondary | ICD-10-CM

## 2016-03-07 HISTORY — DX: Syncope and collapse: R55

## 2016-03-09 ENCOUNTER — Inpatient Hospital Stay (HOSPITAL_COMMUNITY)
Admission: AD | Admit: 2016-03-09 | Discharge: 2016-03-10 | Disposition: A | Discharge status: Home / Self Care | Payer: Medicaid Other | Source: Ambulatory Visit | Attending: Family Medicine | Admitting: Family Medicine

## 2016-03-09 ENCOUNTER — Encounter (HOSPITAL_COMMUNITY): Payer: Self-pay | Admitting: *Deleted

## 2016-03-09 DIAGNOSIS — O26891 Other specified pregnancy related conditions, first trimester: Secondary | ICD-10-CM | POA: Insufficient documentation

## 2016-03-09 DIAGNOSIS — R109 Unspecified abdominal pain: Secondary | ICD-10-CM | POA: Insufficient documentation

## 2016-03-09 DIAGNOSIS — Z3A11 11 weeks gestation of pregnancy: Secondary | ICD-10-CM | POA: Diagnosis not present

## 2016-03-09 DIAGNOSIS — Z79899 Other long term (current) drug therapy: Secondary | ICD-10-CM | POA: Insufficient documentation

## 2016-03-09 DIAGNOSIS — O210 Mild hyperemesis gravidarum: Secondary | ICD-10-CM | POA: Diagnosis not present

## 2016-03-09 DIAGNOSIS — E8889 Other specified metabolic disorders: Secondary | ICD-10-CM

## 2016-03-09 DIAGNOSIS — Z87891 Personal history of nicotine dependence: Secondary | ICD-10-CM | POA: Diagnosis not present

## 2016-03-09 HISTORY — DX: Unspecified abnormal cytological findings in specimens from vagina: R87.629

## 2016-03-09 LAB — CBC WITH DIFFERENTIAL/PLATELET
BASOS PCT: 0 %
Basophils Absolute: 0 10*3/uL (ref 0.0–0.1)
Eosinophils Absolute: 0.1 10*3/uL (ref 0.0–0.7)
Eosinophils Relative: 1 %
HEMATOCRIT: 36.4 % (ref 36.0–46.0)
HEMOGLOBIN: 12.5 g/dL (ref 12.0–15.0)
LYMPHS PCT: 31 %
Lymphs Abs: 2.6 10*3/uL (ref 0.7–4.0)
MCH: 26 pg (ref 26.0–34.0)
MCHC: 34.3 g/dL (ref 30.0–36.0)
MCV: 75.7 fL — AB (ref 78.0–100.0)
MONO ABS: 0.4 10*3/uL (ref 0.1–1.0)
MONOS PCT: 5 %
NEUTROS ABS: 5.3 10*3/uL (ref 1.7–7.7)
NEUTROS PCT: 63 %
Platelets: 242 10*3/uL (ref 150–400)
RBC: 4.81 MIL/uL (ref 3.87–5.11)
RDW: 14.8 % (ref 11.5–15.5)
WBC: 8.4 10*3/uL (ref 4.0–10.5)

## 2016-03-09 LAB — COMPREHENSIVE METABOLIC PANEL
ALBUMIN: 4.3 g/dL (ref 3.5–5.0)
ALK PHOS: 35 U/L — AB (ref 38–126)
ALT: 11 U/L — ABNORMAL LOW (ref 14–54)
AST: 17 U/L (ref 15–41)
Anion gap: 11 (ref 5–15)
BILIRUBIN TOTAL: 0.9 mg/dL (ref 0.3–1.2)
BUN: 8 mg/dL (ref 6–20)
CALCIUM: 9.5 mg/dL (ref 8.9–10.3)
CO2: 22 mmol/L (ref 22–32)
Chloride: 99 mmol/L — ABNORMAL LOW (ref 101–111)
Creatinine, Ser: 0.88 mg/dL (ref 0.44–1.00)
GFR calc Af Amer: >60 mL/min (ref >60)
GLUCOSE: 100 mg/dL — AB (ref 65–99)
Potassium: 3.5 mmol/L (ref 3.5–5.1)
Sodium: 132 mmol/L — ABNORMAL LOW (ref 135–145)
TOTAL PROTEIN: 8.4 g/dL — AB (ref 6.5–8.1)

## 2016-03-09 LAB — URINALYSIS, ROUTINE W REFLEX MICROSCOPIC
GLUCOSE, UA: NEGATIVE mg/dL
Ketones, ur: >80 mg/dL — AB
NITRITE: NEGATIVE
PH: 6 (ref 5.0–8.0)
Protein, ur: 100 mg/dL — AB
SPECIFIC GRAVITY, URINE: 1.025 (ref 1.005–1.030)

## 2016-03-09 LAB — URINE MICROSCOPIC-ADD ON

## 2016-03-09 LAB — WET PREP, GENITAL
Clue Cells Wet Prep HPF POC: NONE SEEN
Sperm: NONE SEEN
Trich, Wet Prep: NONE SEEN
Yeast Wet Prep HPF POC: NONE SEEN

## 2016-03-09 LAB — LIPASE, BLOOD: LIPASE: 22 U/L (ref 11–51)

## 2016-03-09 MED ORDER — DEXTROSE 5 % IN LACTATED RINGERS IV BOLUS
1000.0000 mL | Freq: Once | INTRAVENOUS | Status: AC
  Administered 2016-03-09: 1000 mL via INTRAVENOUS

## 2016-03-09 MED ORDER — ONDANSETRON HCL 4 MG/2ML IJ SOLN
4.0000 mg | Freq: Once | INTRAMUSCULAR | Status: AC
  Administered 2016-03-09: 4 mg via INTRAVENOUS
  Filled 2016-03-09: qty 2

## 2016-03-09 MED ORDER — ONDANSETRON HCL 4 MG PO TABS: 4.0000 mg | ORAL_TABLET | Freq: Four times a day (QID) | ORAL | 0 refills | Status: DC
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
## 2016-03-09 NOTE — MAU Note (Signed)
PT   HAS  ARRIVED   VIA   EMS.-  SAYS    HAS HAD  VAG  SPOTTING  -     IN  UNDERWEAR   - STARTED   ON Monday.      NO PAD ON  NOW.     VOMITING  STARTED    SINCE  12 WEEKS  PREG.      TAKES  PHENERGAN-   LAST TIME  YESTERDAY-   DIDN'T    WORK   .   SPITTING  A LOT  IN TRIAGE.        HAD  FEVER   X4 DAYS -     FEELS  LIKE  A  FEVER.     PNC - WITH  FAMINA-  LAST SEEN - LAST MTH.   NEXT  APPOINTMENT   IS  Tuesday        LOWER BACK AND   ABD  HURTS-   NO MED   FOR  PAIN.

## 2016-03-09 NOTE — Discharge Instructions (Signed)
Eating Plan for Hyperemesis Gravidarum °Severe cases of hyperemesis gravidarum can lead to dehydration and malnutrition. The hyperemesis eating plan is one way to lessen the symptoms of nausea and vomiting. It is often used with prescribed medicines to control your symptoms.  °WHAT CAN I DO TO RELIEVE MY SYMPTOMS? °Listen to your body. Everyone is different and has different preferences. Find what works best for you. Some of the following things may help: °· Eat and drink slowly. °· Eat 5-6 small meals daily instead of 3 large meals.   °· Eat crackers before you get out of bed in the morning.   °· Starchy foods are usually well tolerated (such as cereal, toast, bread, potatoes, pasta, rice, and pretzels).   °· Ginger may help with nausea. Add ¼ tsp ground ginger to hot tea or choose ginger tea.   °· Try drinking 100% fruit juice or an electrolyte drink. °· Continue to take your prenatal vitamins as directed by your health care provider. If you are having trouble taking your prenatal vitamins, talk with your health care provider about different options. °· Include at least 1 serving of protein with your meals and snacks (such as meats or poultry, beans, nuts, eggs, or yogurt). Try eating a protein-rich snack before bed (such as cheese and crackers or a half turkey or peanut butter sandwich). °WHAT THINGS SHOULD I AVOID TO REDUCE MY SYMPTOMS? °The following things may help reduce your symptoms: °· Avoid foods with strong smells. Try eating meals in well-ventilated areas that are free of odors. °· Avoid drinking water or other beverages with meals. Try not to drink anything less than 30 minutes before and after meals. °· Avoid drinking more than 1 cup of fluid at a time. °· Avoid fried or high-fat foods, such as butter and cream sauces. °· Avoid spicy foods. °· Avoid skipping meals the best you can. Nausea can be more intense on an empty stomach. If you cannot tolerate food at that time, do not force it. Try sucking on  ice chips or other frozen items and make up the calories later. °· Avoid lying down within 2 hours after eating. °  °This information is not intended to replace advice given to you by your health care provider. Make sure you discuss any questions you have with your health care provider. °  °Document Released: 05/21/2007 Document Revised: 07/29/2013 Document Reviewed: 05/28/2013 °Elsevier Interactive Patient Education ©2016 [employer] ° °

## 2016-03-10 LAB — GC/CHLAMYDIA PROBE AMP (~~LOC~~) NOT AT ARMC
Chlamydia: NEGATIVE
Neisseria Gonorrhea: NEGATIVE

## 2016-03-10 NOTE — MAU Note (Signed)
E- SIGN  NOT  SIGN-   SAYS  DISCONNECTED

## 2016-03-14 ENCOUNTER — Encounter: Payer: Medicaid Other | Admitting: Obstetrics & Gynecology

## 2016-03-17 ENCOUNTER — Ambulatory Visit (HOSPITAL_COMMUNITY): Admission: RE | Admit: 2016-03-17 | Payer: Medicaid Other | Source: Ambulatory Visit

## 2016-03-17 ENCOUNTER — Encounter (HOSPITAL_COMMUNITY): Payer: Self-pay | Admitting: *Deleted

## 2016-03-17 ENCOUNTER — Inpatient Hospital Stay (HOSPITAL_COMMUNITY)
Admission: AD | Admit: 2016-03-17 | Discharge: 2016-03-17 | Disposition: A | Discharge status: Home / Self Care | Payer: Medicaid Other | Source: Ambulatory Visit | Attending: Family Medicine | Admitting: Family Medicine

## 2016-03-17 ENCOUNTER — Ambulatory Visit (HOSPITAL_COMMUNITY): Payer: Medicaid Other | Attending: Obstetrics & Gynecology

## 2016-03-17 DIAGNOSIS — O210 Mild hyperemesis gravidarum: Secondary | ICD-10-CM | POA: Diagnosis not present

## 2016-03-17 DIAGNOSIS — Z9114 Patient's other noncompliance with medication regimen: Secondary | ICD-10-CM | POA: Diagnosis not present

## 2016-03-17 DIAGNOSIS — Z87891 Personal history of nicotine dependence: Secondary | ICD-10-CM | POA: Insufficient documentation

## 2016-03-17 DIAGNOSIS — Z3A12 12 weeks gestation of pregnancy: Secondary | ICD-10-CM | POA: Insufficient documentation

## 2016-03-17 DIAGNOSIS — E860 Dehydration: Secondary | ICD-10-CM | POA: Insufficient documentation

## 2016-03-17 DIAGNOSIS — R109 Unspecified abdominal pain: Secondary | ICD-10-CM | POA: Diagnosis present

## 2016-03-17 DIAGNOSIS — O99281 Endocrine, nutritional and metabolic diseases complicating pregnancy, first trimester: Secondary | ICD-10-CM | POA: Diagnosis not present

## 2016-03-17 DIAGNOSIS — R1110 Vomiting, unspecified: Secondary | ICD-10-CM | POA: Diagnosis present

## 2016-03-17 LAB — URINE MICROSCOPIC-ADD ON: BACTERIA UA: NONE SEEN

## 2016-03-17 LAB — URINALYSIS, ROUTINE W REFLEX MICROSCOPIC
GLUCOSE, UA: NEGATIVE mg/dL
Ketones, ur: >80 mg/dL — AB
LEUKOCYTES UA: NEGATIVE
Nitrite: NEGATIVE
PH: 6 (ref 5.0–8.0)
Protein, ur: 30 mg/dL — AB

## 2016-03-17 MED ORDER — PROMETHAZINE HCL 25 MG/ML IJ SOLN
25.0000 mg | Freq: Once | INTRAVENOUS | Status: AC
  Administered 2016-03-17: 25 mg via INTRAVENOUS
  Filled 2016-03-17: qty 1

## 2016-03-17 MED ORDER — ONDANSETRON 4 MG PO TBDP: 4.0000 mg | ORAL_TABLET | Freq: Three times a day (TID) | ORAL | 2 refills | Status: DC | PRN

## 2016-03-17 MED ORDER — PROMETHAZINE HCL 25 MG RE SUPP: 25.0000 mg | Freq: Four times a day (QID) | RECTAL | 2 refills | Status: DC | PRN

## 2016-03-17 MED ORDER — FAMOTIDINE IN NACL 20-0.9 MG/50ML-% IV SOLN
20.0000 mg | Freq: Once | INTRAVENOUS | Status: AC
  Administered 2016-03-17: 20 mg via INTRAVENOUS
  Filled 2016-03-17: qty 50

## 2016-03-17 MED ORDER — LACTATED RINGERS IV BOLUS (SEPSIS): 1000.0000 mL | Freq: Once | INTRAVENOUS | Status: DC

## 2016-03-17 MED ORDER — DEXTROSE IN LACTATED RINGERS 5 % IV SOLN
INTRAVENOUS | Status: DC
  Administered 2016-03-17: 17:00:00 via INTRAVENOUS

## 2016-03-17 NOTE — Discharge Instructions (Signed)
Hyperemesis Gravidarum  Hyperemesis gravidarum is a severe form of nausea and vomiting that happens during pregnancy. Hyperemesis is worse than morning sickness. It may cause you to have nausea or vomiting all day for many days. It may keep you from eating and drinking enough food and liquids. Hyperemesis usually occurs during the first half (the first 20 weeks) of pregnancy. It often goes away once a woman is in her second half of pregnancy. However, sometimes hyperemesis continues through an entire pregnancy.   CAUSES   The cause of this condition is not completely known but is thought to be related to changes in the body's hormones when pregnant. It could be from the high level of the pregnancy hormone or an increase in estrogen in the body.   SIGNS AND SYMPTOMS    Severe nausea and vomiting.   Nausea that does not go away.   Vomiting that does not allow you to keep any food down.   Weight loss and body fluid loss (dehydration).   Having no desire to eat or not liking food you have previously enjoyed.  DIAGNOSIS   Your health care provider will do a physical exam and ask you about your symptoms. He or she may also order blood tests and urine tests to make sure something else is not causing the problem.   TREATMENT   You may only need medicine to control the problem. If medicines do not control the nausea and vomiting, you will be treated in the hospital to prevent dehydration, increased acid in the blood (acidosis), weight loss, and changes in the electrolytes in your body that may harm the unborn baby (fetus). You may need IV fluids.   HOME CARE INSTRUCTIONS    Only take over-the-counter or prescription medicines as directed by your health care provider.   Try eating a couple of dry crackers or toast in the morning before getting out of bed.   Avoid foods and smells that upset your stomach.   Avoid fatty and spicy foods.   Eat 5-6 small meals a day.   Do not drink when eating meals. Drink between  meals.   For snacks, eat high-protein foods, such as cheese.   Eat or suck on things that have ginger in them. Ginger helps nausea.   Avoid food preparation. The smell of food can spoil your appetite.   Avoid iron pills and iron in your multivitamins until after 3-4 months of being pregnant. However, consult with your health care provider before stopping any prescribed iron pills.  SEEK MEDICAL CARE IF:    Your abdominal pain increases.   You have a severe headache.   You have vision problems.   You are losing weight.  SEEK IMMEDIATE MEDICAL CARE IF:    You are unable to keep fluids down.   You vomit blood.   You have constant nausea and vomiting.   You have excessive weakness.   You have extreme thirst.   You have dizziness or fainting.   You have a fever or persistent symptoms for more than 2-3 days.   You have a fever and your symptoms suddenly get worse.  MAKE SURE YOU:    Understand these instructions.   Will watch your condition.   Will get help right away if you are not doing well or get worse.     This information is not intended to replace advice given to you by your health care provider. Make sure you discuss any questions you have with   your health care provider.     Document Released: 07/24/2005 Document Revised: 05/14/2013 Document Reviewed: 03/05/2013  Elsevier Interactive Patient Education 2016 [employer]
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
## 2016-03-17 NOTE — MAU Note (Signed)
[facility]  Pt states she is having nausea and vomiting.  Pt states she can't keep anything down.  Pt states she is having "pain everywhere."  When asked multiple times pt states, "in the bottom, middle, and top of my stomach."  Pt states she has a fever on and off.  When asked how high her fever has gotten pt states, "I don't know.  I don't have no thermometer."

## 2016-03-28 ENCOUNTER — Encounter (HOSPITAL_COMMUNITY): Payer: Self-pay | Admitting: Emergency Medicine

## 2016-03-28 ENCOUNTER — Inpatient Hospital Stay (HOSPITAL_COMMUNITY)
Admission: EM | Admit: 2016-03-28 | Discharge: 2016-03-30 | DRG: 781 | Disposition: A | Discharge status: Home / Self Care | Payer: Medicaid Other | Attending: Internal Medicine | Admitting: Internal Medicine

## 2016-03-28 DIAGNOSIS — O211 Hyperemesis gravidarum with metabolic disturbance: Secondary | ICD-10-CM | POA: Diagnosis present

## 2016-03-28 DIAGNOSIS — E869 Volume depletion, unspecified: Secondary | ICD-10-CM | POA: Diagnosis present

## 2016-03-28 DIAGNOSIS — N179 Acute kidney failure, unspecified: Secondary | ICD-10-CM | POA: Diagnosis present

## 2016-03-28 DIAGNOSIS — E860 Dehydration: Secondary | ICD-10-CM | POA: Diagnosis present

## 2016-03-28 DIAGNOSIS — O99282 Endocrine, nutritional and metabolic diseases complicating pregnancy, second trimester: Secondary | ICD-10-CM | POA: Diagnosis present

## 2016-03-28 DIAGNOSIS — R55 Syncope and collapse: Secondary | ICD-10-CM

## 2016-03-28 DIAGNOSIS — Z87891 Personal history of nicotine dependence: Secondary | ICD-10-CM

## 2016-03-28 DIAGNOSIS — O210 Mild hyperemesis gravidarum: Secondary | ICD-10-CM | POA: Diagnosis present

## 2016-03-28 DIAGNOSIS — O209 Hemorrhage in early pregnancy, unspecified: Secondary | ICD-10-CM | POA: Diagnosis present

## 2016-03-28 DIAGNOSIS — Z3A14 14 weeks gestation of pregnancy: Secondary | ICD-10-CM

## 2016-03-28 DIAGNOSIS — O26832 Pregnancy related renal disease, second trimester: Principal | ICD-10-CM | POA: Diagnosis present

## 2016-03-28 DIAGNOSIS — E876 Hypokalemia: Secondary | ICD-10-CM

## 2016-03-28 HISTORY — DX: Syncope and collapse: R55

## 2016-03-28 LAB — BASIC METABOLIC PANEL
ANION GAP: 12 (ref 5–15)
BUN: 21 mg/dL — ABNORMAL HIGH (ref 6–20)
CO2: 21 mmol/L — ABNORMAL LOW (ref 22–32)
Calcium: 10.1 mg/dL (ref 8.9–10.3)
Chloride: 105 mmol/L (ref 101–111)
Creatinine, Ser: 1.53 mg/dL — ABNORMAL HIGH (ref 0.44–1.00)
GFR, EST AFRICAN AMERICAN: 51 mL/min — AB (ref >60)
GFR, EST NON AFRICAN AMERICAN: 44 mL/min — AB (ref >60)
Glucose, Bld: 136 mg/dL — ABNORMAL HIGH (ref 65–99)
POTASSIUM: 3.5 mmol/L (ref 3.5–5.1)
SODIUM: 138 mmol/L (ref 135–145)

## 2016-03-28 LAB — CBC WITH DIFFERENTIAL/PLATELET
BASOS ABS: 0 10*3/uL (ref 0.0–0.1)
Basophils Relative: 0 %
Eosinophils Absolute: 0 10*3/uL (ref 0.0–0.7)
Eosinophils Relative: 0 %
HEMATOCRIT: 44.8 % (ref 36.0–46.0)
Hemoglobin: 15.1 g/dL — ABNORMAL HIGH (ref 12.0–15.0)
LYMPHS PCT: 15 %
Lymphs Abs: 1.8 10*3/uL (ref 0.7–4.0)
MCH: 26.8 pg (ref 26.0–34.0)
MCHC: 33.7 g/dL (ref 30.0–36.0)
MCV: 79.6 fL (ref 78.0–100.0)
Monocytes Absolute: 1.1 10*3/uL — ABNORMAL HIGH (ref 0.1–1.0)
Monocytes Relative: 9 %
NEUTROS ABS: 9.1 10*3/uL — AB (ref 1.7–7.7)
Neutrophils Relative %: 76 %
PLATELETS: 198 10*3/uL (ref 150–400)
RBC: 5.63 MIL/uL — AB (ref 3.87–5.11)
RDW: 16.1 % — ABNORMAL HIGH (ref 11.5–15.5)
WBC: 12 10*3/uL — AB (ref 4.0–10.5)

## 2016-03-28 LAB — PROTIME-INR
INR: 1.72
Prothrombin Time: 20.4 seconds — ABNORMAL HIGH (ref 11.4–15.2)

## 2016-03-28 LAB — CBG MONITORING, ED: GLUCOSE-CAPILLARY: 146 mg/dL — AB (ref 65–99)

## 2016-03-28 MED ORDER — SODIUM CHLORIDE 0.9 % IV BOLUS (SEPSIS)
2000.0000 mL | Freq: Once | INTRAVENOUS | Status: AC
  Administered 2016-03-28: 2000 mL via INTRAVENOUS

## 2016-03-28 MED ORDER — SODIUM CHLORIDE 0.9 % IV BOLUS (SEPSIS)
1000.0000 mL | Freq: Once | INTRAVENOUS | Status: AC
  Administered 2016-03-28: 1000 mL via INTRAVENOUS

## 2016-03-28 MED ORDER — METOCLOPRAMIDE HCL 5 MG/ML IJ SOLN
10.0000 mg | Freq: Once | INTRAMUSCULAR | Status: AC
  Administered 2016-03-29: 10 mg via INTRAVENOUS
  Filled 2016-03-28: qty 2

## 2016-03-28 MED ORDER — ONDANSETRON 4 MG PO TBDP: 4.0000 mg | ORAL_TABLET | Freq: Once | ORAL | Status: DC

## 2016-03-28 NOTE — ED Notes (Signed)
CBG is 146. 
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
## 2016-03-28 NOTE — ED Triage Notes (Signed)
Pt BIB [facility] from home after 2 witnessed syncopal episodes. Pt is [redacted] weeks pregnant. Reports extreme weakness since onset of pregnancy. EMS reports patient initial BP to be systolic of 67. On arrival BP 110/85. Pt is confused. Unable to tell what month/day it is or who the president is. Pt is alert. Positive for nausea with mostly dry heaving. G4P2, 1 miscarriage.

## 2016-03-29 ENCOUNTER — Encounter (HOSPITAL_COMMUNITY): Payer: Self-pay | Admitting: General Practice

## 2016-03-29 DIAGNOSIS — N179 Acute kidney failure, unspecified: Secondary | ICD-10-CM | POA: Diagnosis present

## 2016-03-29 DIAGNOSIS — O211 Hyperemesis gravidarum with metabolic disturbance: Secondary | ICD-10-CM | POA: Diagnosis present

## 2016-03-29 DIAGNOSIS — R55 Syncope and collapse: Secondary | ICD-10-CM | POA: Diagnosis present

## 2016-03-29 DIAGNOSIS — E869 Volume depletion, unspecified: Secondary | ICD-10-CM | POA: Diagnosis not present

## 2016-03-29 DIAGNOSIS — Z3A14 14 weeks gestation of pregnancy: Secondary | ICD-10-CM | POA: Diagnosis not present

## 2016-03-29 DIAGNOSIS — O210 Mild hyperemesis gravidarum: Secondary | ICD-10-CM | POA: Diagnosis not present

## 2016-03-29 DIAGNOSIS — O26832 Pregnancy related renal disease, second trimester: Secondary | ICD-10-CM | POA: Diagnosis present

## 2016-03-29 DIAGNOSIS — Z87891 Personal history of nicotine dependence: Secondary | ICD-10-CM | POA: Diagnosis not present

## 2016-03-29 DIAGNOSIS — O99282 Endocrine, nutritional and metabolic diseases complicating pregnancy, second trimester: Secondary | ICD-10-CM | POA: Diagnosis present

## 2016-03-29 DIAGNOSIS — E860 Dehydration: Secondary | ICD-10-CM | POA: Diagnosis present

## 2016-03-29 DIAGNOSIS — O209 Hemorrhage in early pregnancy, unspecified: Secondary | ICD-10-CM | POA: Diagnosis present

## 2016-03-29 LAB — CBC
HEMATOCRIT: 34.8 % — AB (ref 36.0–46.0)
Hemoglobin: 11.3 g/dL — ABNORMAL LOW (ref 12.0–15.0)
MCH: 25.9 pg — ABNORMAL LOW (ref 26.0–34.0)
MCHC: 32.5 g/dL (ref 30.0–36.0)
MCV: 79.8 fL (ref 78.0–100.0)
PLATELETS: 213 10*3/uL (ref 150–400)
RBC: 4.36 MIL/uL (ref 3.87–5.11)
RDW: 16.2 % — AB (ref 11.5–15.5)
WBC: 13 10*3/uL — ABNORMAL HIGH (ref 4.0–10.5)

## 2016-03-29 LAB — URINALYSIS, ROUTINE W REFLEX MICROSCOPIC
GLUCOSE, UA: NEGATIVE mg/dL
NITRITE: NEGATIVE
PROTEIN: 100 mg/dL — AB
Specific Gravity, Urine: 1.029 (ref 1.005–1.030)
pH: 6 (ref 5.0–8.0)

## 2016-03-29 LAB — COMPREHENSIVE METABOLIC PANEL
ALT: 71 U/L — ABNORMAL HIGH (ref 14–54)
ANION GAP: 10 (ref 5–15)
AST: 59 U/L — ABNORMAL HIGH (ref 15–41)
Albumin: 2.9 g/dL — ABNORMAL LOW (ref 3.5–5.0)
Alkaline Phosphatase: 39 U/L (ref 38–126)
BILIRUBIN TOTAL: 0.9 mg/dL (ref 0.3–1.2)
BUN: 15 mg/dL (ref 6–20)
CO2: 19 mmol/L — ABNORMAL LOW (ref 22–32)
Calcium: 8.4 mg/dL — ABNORMAL LOW (ref 8.9–10.3)
Chloride: 113 mmol/L — ABNORMAL HIGH (ref 101–111)
Creatinine, Ser: 1.08 mg/dL — ABNORMAL HIGH (ref 0.44–1.00)
Glucose, Bld: 116 mg/dL — ABNORMAL HIGH (ref 65–99)
POTASSIUM: 3.1 mmol/L — AB (ref 3.5–5.1)
Sodium: 142 mmol/L (ref 135–145)
TOTAL PROTEIN: 6.1 g/dL — AB (ref 6.5–8.1)

## 2016-03-29 LAB — MAGNESIUM
MAGNESIUM: 2.2 mg/dL (ref 1.7–2.4)
MAGNESIUM: 1.9 mg/dL (ref 1.7–2.4)

## 2016-03-29 LAB — URINE MICROSCOPIC-ADD ON

## 2016-03-29 LAB — GLUCOSE, CAPILLARY: GLUCOSE-CAPILLARY: 104 mg/dL — AB (ref 65–99)

## 2016-03-29 MED ORDER — SODIUM CHLORIDE 0.9% FLUSH
3.0000 mL | Freq: Two times a day (BID) | INTRAVENOUS | Status: DC
  Administered 2016-03-29 – 2016-03-30 (×2): 3 mL via INTRAVENOUS

## 2016-03-29 MED ORDER — POTASSIUM CHLORIDE IN NACL 20-0.9 MEQ/L-% IV SOLN
INTRAVENOUS | Status: DC
  Administered 2016-03-29: 1000 mL via INTRAVENOUS
  Administered 2016-03-29 – 2016-03-30 (×2): via INTRAVENOUS
  Filled 2016-03-29 (×4): qty 1000

## 2016-03-29 MED ORDER — POTASSIUM CHLORIDE CRYS ER 20 MEQ PO TBCR: 40.0000 meq | EXTENDED_RELEASE_TABLET | ORAL | Status: DC

## 2016-03-29 MED ORDER — PYRIDOXINE HCL 100 MG/ML IJ SOLN
100.0000 mg | Freq: Every day | INTRAMUSCULAR | Status: DC
  Administered 2016-03-29 – 2016-03-30 (×2): 100 mg via INTRAVENOUS
  Filled 2016-03-29 (×2): qty 1

## 2016-03-29 MED ORDER — POTASSIUM CHLORIDE CRYS ER 20 MEQ PO TBCR
40.0000 meq | EXTENDED_RELEASE_TABLET | ORAL | Status: DC
  Filled 2016-03-29 (×2): qty 2

## 2016-03-29 MED ORDER — POTASSIUM CHLORIDE 20 MEQ/15ML (10%) PO SOLN: 40.0000 meq | ORAL | Status: DC

## 2016-03-29 MED ORDER — POTASSIUM CHLORIDE 10 MEQ/100ML IV SOLN
10.0000 meq | INTRAVENOUS | Status: AC
  Administered 2016-03-29 (×2): 10 meq via INTRAVENOUS
  Filled 2016-03-29 (×2): qty 100

## 2016-03-29 MED ORDER — FAMOTIDINE IN NACL 20-0.9 MG/50ML-% IV SOLN
20.0000 mg | Freq: Two times a day (BID) | INTRAVENOUS | Status: DC
  Administered 2016-03-29 – 2016-03-30 (×4): 20 mg via INTRAVENOUS
  Filled 2016-03-29 (×5): qty 50

## 2016-03-29 MED ORDER — ACETAMINOPHEN 325 MG PO TABS
650.0000 mg | ORAL_TABLET | Freq: Four times a day (QID) | ORAL | Status: DC | PRN
  Administered 2016-03-30: 650 mg via ORAL
  Filled 2016-03-29: qty 2

## 2016-03-29 MED ORDER — METOCLOPRAMIDE HCL 5 MG/ML IJ SOLN
5.0000 mg | INTRAMUSCULAR | Status: DC | PRN
  Administered 2016-03-29 – 2016-03-30 (×5): 5 mg via INTRAVENOUS
  Filled 2016-03-29 (×5): qty 2

## 2016-03-29 NOTE — Progress Notes (Signed)
[facility] PROGRESS NOTE  Erica [name] RUE:[id number] DOB: 01/19/1981 DOA: 03/28/2016 PCP: Dorrene GermanEdwin A Avbuere, MD  Interim summary and HPI 35 y/o female without significant past medical history; who presented to Ed secondary to nausea/vomiting and 2 episodes of syncope. Patient is pregnant and has been experiencing hyperemesis gravidarum. She reports up to 15 episodes of vomiting per day and inability to keep anything dow. She was found to be dehydrated, with hypokalemia and AKI. She denies chest pain, palpitations, dyspnea, PND, orthopnea or pitting edema of the lower extremities.   Assessment/Plan: 1-syncope: -no further events since admission -vasovagal from vomiting and due to orthostatic changes from dehydration  -will continue IVF's and will replete electrolytes -2-D echo order to be thorough, pending at this moment -will check orthostatic VS in am  2-hypokalemia: -due to GI loses and poor PO intake -will continue repletion   3-AKI: -due to dehydration -better/improved with IVF's -will monitor renal function trend  4-hyperemesis Gravidarum  -continue PRN zofran and reglan -continue IVF's and electrolytes repletion    Code Status: Full Family Communication: no family at bedside  Disposition Plan: home when medically stable. Anticipate discharge in tomorrow 8/24 (if able to tolerate better PO intake)   Consultants:  None   Procedures:  2-Echo  Antibiotics:  None   HPI/Subjective: Afebrile, no CP or SOB. Patient still with nausea and dry heaves. Also endorses some lid lightheadedness sensation when standing.  Objective: Vitals:   03/29/16 0442 03/29/16 1200  BP: 113/73 112/75  Pulse: 84 88  Resp: 20 20  Temp: 98.3 F (36.8 C) 98.1 F (36.7 C)    Intake/Output Summary (Last 24 hours) at 03/29/16 1635 Last data filed at 03/29/16 0900  Gross per 24 hour  Intake              480 ml  Output              250 ml  Net              230 ml   Filed  Weights   03/28/16 1910 03/29/16 0054  Weight: 75.8 kg (167 lb) 73.5 kg (162 lb)    Exam:   General:  Afebrile, feeling a little better. Still with some lightheadedness sensation when standing and with nausea; not vomiting today, but with dry heaves.    Cardiovascular: S1 and S2, no rubs, no gallops  Respiratory: CTA bilaterally  Abdomen: soft, positive BS, no tenderness   Musculoskeletal: no edema or cyanosis   Data Reviewed: Basic Metabolic Panel:  Recent Labs Lab 03/28/16 2108 03/29/16 0422  NA 138 142  K 3.5 3.1*  CL 105 113*  CO2 21* 19*  GLUCOSE 136* 116*  BUN 21* 15  CREATININE 1.53* 1.08*  CALCIUM 10.1 8.4*  MG 2.2 1.9   Liver Function Tests:  Recent Labs Lab 03/29/16 0422  AST 59*  ALT 71*  ALKPHOS 39  BILITOT 0.9  PROT 6.1*  ALBUMIN 2.9*   CBC:  Recent Labs Lab 03/28/16 2108 03/29/16 0422  WBC 12.0* 13.0*  NEUTROABS 9.1*  --   HGB 15.1* 11.3*  HCT 44.8 34.8*  MCV 79.6 79.8  PLT 198 213   CBG:  Recent Labs Lab 03/28/16 2011 03/29/16 0624  GLUCAP 146* 104*    Studies: No results found.  Scheduled Meds: . famotidine (PEPCID) IV  20 mg Intravenous Q12H  . potassium chloride  10 mEq Intravenous Q1 Hr x 2  . pyridOXINE  100 mg Intravenous Daily  .  sodium chloride flush  3 mL Intravenous Q12H  . sodium chloride flush  3 mL Intravenous Q12H   Continuous Infusions: . 0.9 % NaCl with KCl 20 mEq / L 1,000 mL (03/29/16 1319)    Principal Problem:   Syncope Active Problems:   AKI (acute kidney injury) (HCC)   Hyperemesis gravidarum   Volume depletion    Time spent: 30 minutes    Vassie LollMadera, Erica [name]  [facility] Pager [phone]. If 7PM-7AM, please contact night-coverage at [email or web address], password [email or web address] 03/29/2016, 4:35 PM  LOS: 0 days

## 2016-03-29 NOTE — Progress Notes (Signed)
Erica RombergKaren Cobbs ED RN came to unit to perform assessment of fetal heart tones, as ordered; however, unable to attain a verified heart tone rate.  Fetal Ultrasound recommended.

## 2016-03-30 ENCOUNTER — Inpatient Hospital Stay (HOSPITAL_COMMUNITY): Payer: Medicaid Other

## 2016-03-30 DIAGNOSIS — R55 Syncope and collapse: Secondary | ICD-10-CM

## 2016-03-30 DIAGNOSIS — E876 Hypokalemia: Secondary | ICD-10-CM

## 2016-03-30 LAB — BASIC METABOLIC PANEL
Anion gap: 6 (ref 5–15)
BUN: <5 mg/dL — ABNORMAL LOW (ref 6–20)
CALCIUM: 8.7 mg/dL — AB (ref 8.9–10.3)
CO2: 21 mmol/L — AB (ref 22–32)
CREATININE: 0.8 mg/dL (ref 0.44–1.00)
Chloride: 110 mmol/L (ref 101–111)
GFR calc Af Amer: >60 mL/min (ref >60)
Glucose, Bld: 114 mg/dL — ABNORMAL HIGH (ref 65–99)
Potassium: 3.3 mmol/L — ABNORMAL LOW (ref 3.5–5.1)
Sodium: 137 mmol/L (ref 135–145)

## 2016-03-30 LAB — ECHOCARDIOGRAM COMPLETE
Height: 63 in
Weight: 2691.2 oz

## 2016-03-30 LAB — CBC
HCT: 31.8 % — ABNORMAL LOW (ref 36.0–46.0)
HEMOGLOBIN: 10.3 g/dL — AB (ref 12.0–15.0)
MCH: 25.9 pg — AB (ref 26.0–34.0)
MCHC: 32.4 g/dL (ref 30.0–36.0)
MCV: 80.1 fL (ref 78.0–100.0)
PLATELETS: 173 10*3/uL (ref 150–400)
RBC: 3.97 MIL/uL (ref 3.87–5.11)
RDW: 16.6 % — ABNORMAL HIGH (ref 11.5–15.5)
WBC: 10.2 10*3/uL (ref 4.0–10.5)

## 2016-03-30 LAB — GLUCOSE, CAPILLARY: GLUCOSE-CAPILLARY: 85 mg/dL (ref 65–99)

## 2016-03-30 LAB — MAGNESIUM: MAGNESIUM: 1.8 mg/dL (ref 1.7–2.4)

## 2016-03-30 MED ORDER — POTASSIUM CHLORIDE ER 10 MEQ PO TBCR: 20.0000 meq | EXTENDED_RELEASE_TABLET | Freq: Every day | ORAL | 0 refills | Status: DC

## 2016-03-30 MED ORDER — METOCLOPRAMIDE HCL 5 MG PO TABS: 5.0000 mg | ORAL_TABLET | Freq: Three times a day (TID) | ORAL | 0 refills | Status: DC | PRN

## 2016-03-30 MED ORDER — ONDANSETRON 4 MG PO TBDP: 4.0000 mg | ORAL_TABLET | Freq: Three times a day (TID) | ORAL | 1 refills | Status: DC | PRN

## 2016-03-30 NOTE — Progress Notes (Signed)
Patient complains of stomach and lower back pain. Tylenol administered Erica [name] A Jaeson Molstad, RN

## 2016-03-30 NOTE — Progress Notes (Signed)
Received consult Patient states she" does not have transportation to get to a pharmacy to get her medications "  CM talked to patient; payer source Medicaid; [facility] Patient stated that the pharmacy is close to her home but cannot pick up her medication. Patient has multiple friends / family members that can assist her. CM talked to patient about having a friend/ family member take her to the pharmacy. She reports no problem going other places ex grocery store, shopping, movies. Emotional support given. Abelino DerrickB Elkin Belfield [facility] HospitalRN,MHA,BSN [phone]

## 2016-03-30 NOTE — Progress Notes (Signed)
  Echocardiogram 2D Echocardiogram has been performed.  Erica [name], Erica [name] 03/30/2016, 12:54 PM

## 2016-03-30 NOTE — Discharge Summary (Signed)
Physician Discharge Summary  Erica [name] ZOX:[id number] DOB: 03/04/1981 DOA: 03/28/2016  PCP: Dorrene GermanEdwin A Avbuere, MD  Admit date: 03/28/2016 Discharge date: 03/30/2016  Time spent: 35 minutes  Recommendations for Outpatient Follow-up:  1. Repeat BMET to follow electrolytes and renal function  2. Repeat CBC to follow Hgb trend    Discharge Diagnoses:  Principal Problem:   Syncope and collapse Active Problems:   AKI (acute kidney injury) (HCC)   Hyperemesis gravidarum   Volume depletion   Hypokalemia Leukocytosis   Discharge Condition: stable and improved. Discharge home with instructions to follow up with OB-GYN and with PCP as an outpatient   Diet recommendation: regular diet   Filed Weights   03/28/16 1910 03/29/16 0054 03/30/16 0411  Weight: 75.8 kg (167 lb) 73.5 kg (162 lb) 76.3 kg (168 lb 3.2 oz)    History of present illness:  35 y/o female without significant past medical history; who presented to Ed secondary to nausea/vomiting and 2 episodes of syncope. Patient is pregnant and has been experiencing hyperemesis gravidarum. She reports up to 15 episodes of vomiting per day and inability to keep anything dow. She was found to be dehydrated, with hypokalemia and AKI. She denies chest pain, palpitations, dyspnea, PND, orthopnea or pitting edema of the lower extremities.  Hospital Course:  1-syncope: -no further events since admission -vasovagal from vomiting events and/or due to orthostatic changes from dehydration  -received fluid resuscitation and was encourage to maintain good hydration at discharge  -2-D echo ordered and w/o any structural abnormalities -neg orthostatic VS prior to discharge  2-hypokalemia: -due to GI loses and poor PO intake -will continue repletion  -3.3 at discharge; will give 20 meq daily for another 5 days as part of maintenance supplementation -patient advise to take her pre-natal vitamis  3-AKI: -due to dehydration -improved/resolved  with IVF's -will recommend repeat BMET at follow up to re-assess renal function and electrolytes -Cr WNL at discharge -patient advise to keep herself well hydrated   4-hyperemesis Gravidarum  -continue PRN zofran and reglan -continue IVF's and electrolytes repletion    5-leukocytosis  -due to stress demargination -resolved with hydration -repeat CBC at follow up visit  6-pregnancy  -patient is [redacted] weeks pregnant  -continue outpatient pre-natal care with OB-GYN  Procedures: 2-D echo: Study Conclusions - Left ventricle: The cavity size was normal. Wall thickness was   normal. Systolic function was normal. The estimated ejection   fraction was in the range of 55% to 60%.  Consultations:  None   Discharge Exam: Vitals:   03/30/16 0749 03/30/16 1445  BP:  108/68  Pulse: 65 68  Resp: 18 18  Temp: 98.6 F (37 C) 97.8 F (36.6 C)    General:  Afebrile, no further vomiting and keeping meds down. Still with intermittent nausea. No further syncope or near syncope events.  Cardiovascular: S1 and S2, no rubs, no gallops, no murmurs  Respiratory: CTA bilaterally  Abdomen: soft, positive BS, no tenderness   Musculoskeletal: no edema or cyanosis   Discharge Instructions   Discharge Instructions    Discharge instructions    Complete by:  As directed   Keep yourself well hydrated Take medications as prescribed Follow up with PCP/OB-GYN in 1 week   Increase activity slowly    Complete by:  As directed     Current Discharge Medication List    START taking these medications   Details  metoCLOPramide (REGLAN) 5 MG tablet Take 1 tablet (5 mg total) by mouth every  8 (eight) hours as needed for nausea, vomiting or refractory nausea / vomiting. Qty: 45 tablet, Refills: 0    potassium chloride (K-DUR) 10 MEQ tablet Take 2 tablets (20 mEq total) by mouth daily. Qty: 10 tablet, Refills: 0      CONTINUE these medications which have CHANGED   Details  ondansetron (ZOFRAN  ODT) 4 MG disintegrating tablet Take 1 tablet (4 mg total) by mouth every 8 (eight) hours as needed for nausea or vomiting. Qty: 30 tablet, Refills: 1      CONTINUE these medications which have NOT CHANGED   Details  albuterol (PROVENTIL HFA;VENTOLIN HFA) 108 (90 Base) MCG/ACT inhaler Inhale 1-2 puffs into the lungs every 6 (six) hours as needed for wheezing or shortness of breath. Qty: 1 Inhaler, Refills: 0    cetirizine (ZYRTEC) 10 MG tablet Take 10 mg by mouth daily as needed for allergies.  Refills: 4    promethazine (PHENERGAN) 25 MG suppository Place 1 suppository (25 mg total) rectally every 6 (six) hours as needed for nausea or vomiting. Qty: 12 each, Refills: 2    Prenatal w/o A Vit-Fe Fum-FA (PRENATAL VITAMIN W/FE, FA) 29-1 MG CHEW Chew 1 tablet by mouth daily. Qty: 30 tablet, Refills: 0       No Known Allergies Follow-up Information    Dorrene German, MD Follow up in 1 week(s).   Specialty:  Internal Medicine Contact information: [address] [place] [address] [phone]           The results of significant diagnostics from this hospitalization (including imaging, microbiology, ancillary and laboratory) are listed below for reference.     Labs: Basic Metabolic Panel:  Recent Labs Lab 03/28/16 2108 03/29/16 0422 03/30/16 1130  NA 138 142 137  K 3.5 3.1* 3.3*  CL 105 113* 110  CO2 21* 19* 21*  GLUCOSE 136* 116* 114*  BUN 21* 15 <5*  CREATININE 1.53* 1.08* 0.80  CALCIUM 10.1 8.4* 8.7*  MG 2.2 1.9 1.8   Liver Function Tests:  Recent Labs Lab 03/29/16 0422  AST 59*  ALT 71*  ALKPHOS 39  BILITOT 0.9  PROT 6.1*  ALBUMIN 2.9*   CBC:  Recent Labs Lab 03/28/16 2108 03/29/16 0422 03/30/16 1130  WBC 12.0* 13.0* 10.2  NEUTROABS 9.1*  --   --   HGB 15.1* 11.3* 10.3*  HCT 44.8 34.8* 31.8*  MCV 79.6 79.8 80.1  PLT 198 213 173   CBG:  Recent Labs Lab 03/28/16 2011 03/29/16 0624 03/30/16 0616  GLUCAP 146* 104* 85     Signed:  Vassie Loll MD.  [facility] 03/30/2016, 3:58 PM

## 2016-03-30 NOTE — Progress Notes (Signed)
Went over and patient understands discharge orders. IV and telemetry removed, family at bedside for discharge Erica [name], counsellingColter, RN

## 2016-04-04 ENCOUNTER — Encounter (HOSPITAL_COMMUNITY): Payer: Self-pay

## 2016-04-04 ENCOUNTER — Inpatient Hospital Stay (HOSPITAL_COMMUNITY)
Admission: AD | Admit: 2016-04-04 | Discharge: 2016-04-05 | Disposition: A | Discharge status: Home / Self Care | Payer: Medicaid Other | Source: Intra-hospital | Attending: Obstetrics & Gynecology | Admitting: Obstetrics & Gynecology

## 2016-04-04 DIAGNOSIS — Z3A15 15 weeks gestation of pregnancy: Secondary | ICD-10-CM | POA: Insufficient documentation

## 2016-04-04 DIAGNOSIS — O99282 Endocrine, nutritional and metabolic diseases complicating pregnancy, second trimester: Secondary | ICD-10-CM | POA: Diagnosis not present

## 2016-04-04 DIAGNOSIS — R109 Unspecified abdominal pain: Secondary | ICD-10-CM | POA: Insufficient documentation

## 2016-04-04 DIAGNOSIS — O211 Hyperemesis gravidarum with metabolic disturbance: Secondary | ICD-10-CM | POA: Diagnosis present

## 2016-04-04 DIAGNOSIS — O26892 Other specified pregnancy related conditions, second trimester: Secondary | ICD-10-CM | POA: Insufficient documentation

## 2016-04-04 DIAGNOSIS — O210 Mild hyperemesis gravidarum: Secondary | ICD-10-CM | POA: Diagnosis not present

## 2016-04-04 DIAGNOSIS — Z79899 Other long term (current) drug therapy: Secondary | ICD-10-CM | POA: Insufficient documentation

## 2016-04-04 DIAGNOSIS — E876 Hypokalemia: Secondary | ICD-10-CM | POA: Insufficient documentation

## 2016-04-04 DIAGNOSIS — Z3491 Encounter for supervision of normal pregnancy, unspecified, first trimester: Secondary | ICD-10-CM

## 2016-04-04 DIAGNOSIS — Z87891 Personal history of nicotine dependence: Secondary | ICD-10-CM | POA: Diagnosis not present

## 2016-04-04 DIAGNOSIS — M549 Dorsalgia, unspecified: Secondary | ICD-10-CM | POA: Insufficient documentation

## 2016-04-04 LAB — COMPREHENSIVE METABOLIC PANEL
ALT: 89 U/L — ABNORMAL HIGH (ref 14–54)
ANION GAP: 8 (ref 5–15)
AST: 51 U/L — AB (ref 15–41)
Albumin: 3 g/dL — ABNORMAL LOW (ref 3.5–5.0)
Alkaline Phosphatase: 37 U/L — ABNORMAL LOW (ref 38–126)
BUN: <5 mg/dL — ABNORMAL LOW (ref 6–20)
CHLORIDE: 97 mmol/L — AB (ref 101–111)
CO2: 27 mmol/L (ref 22–32)
Calcium: 8.4 mg/dL — ABNORMAL LOW (ref 8.9–10.3)
Creatinine, Ser: 0.6 mg/dL (ref 0.44–1.00)
Glucose, Bld: 86 mg/dL (ref 65–99)
POTASSIUM: 2.8 mmol/L — AB (ref 3.5–5.1)
Sodium: 132 mmol/L — ABNORMAL LOW (ref 135–145)
TOTAL PROTEIN: 6.6 g/dL (ref 6.5–8.1)
Total Bilirubin: 0.4 mg/dL (ref 0.3–1.2)

## 2016-04-04 LAB — URINALYSIS, ROUTINE W REFLEX MICROSCOPIC
GLUCOSE, UA: 100 mg/dL — AB
KETONES UR: NEGATIVE mg/dL
LEUKOCYTES UA: NEGATIVE
NITRITE: NEGATIVE
PH: 7 (ref 5.0–8.0)
Protein, ur: 100 mg/dL — AB
Specific Gravity, Urine: 1.025 (ref 1.005–1.030)

## 2016-04-04 LAB — CBC
HEMATOCRIT: 30.9 % — AB (ref 36.0–46.0)
Hemoglobin: 10.4 g/dL — ABNORMAL LOW (ref 12.0–15.0)
MCH: 26.4 pg (ref 26.0–34.0)
MCHC: 33.7 g/dL (ref 30.0–36.0)
MCV: 78.4 fL (ref 78.0–100.0)
Platelets: 200 10*3/uL (ref 150–400)
RBC: 3.94 MIL/uL (ref 3.87–5.11)
RDW: 15.9 % — ABNORMAL HIGH (ref 11.5–15.5)
WBC: 6.6 10*3/uL (ref 4.0–10.5)

## 2016-04-04 LAB — URINE MICROSCOPIC-ADD ON

## 2016-04-04 MED ORDER — PROMETHAZINE HCL 25 MG RE SUPP
12.5000 mg | Freq: Once | RECTAL | Status: AC
  Administered 2016-04-05: 12.5 mg via RECTAL
  Filled 2016-04-04: qty 1

## 2016-04-04 MED ORDER — SODIUM CHLORIDE 0.9 % IV BOLUS (SEPSIS)
1000.0000 mL | Freq: Once | INTRAVENOUS | Status: AC
  Administered 2016-04-04: 1000 mL via INTRAVENOUS

## 2016-04-04 MED ORDER — POTASSIUM CHLORIDE CRYS ER 20 MEQ PO TBCR
30.0000 meq | EXTENDED_RELEASE_TABLET | Freq: Two times a day (BID) | ORAL | Status: DC
  Administered 2016-04-05: 30 meq via ORAL
  Filled 2016-04-04 (×2): qty 1

## 2016-04-04 MED ORDER — PROMETHAZINE HCL 25 MG PO TABS
25.0000 mg | ORAL_TABLET | Freq: Once | ORAL | Status: DC
  Filled 2016-04-04: qty 1

## 2016-04-04 MED ORDER — LACTATED RINGERS IV BOLUS (SEPSIS): 1000.0000 mL | Freq: Once | INTRAVENOUS | Status: DC

## 2016-04-04 MED ORDER — ONDANSETRON HCL 40 MG/20ML IJ SOLN
8.0000 mg | Freq: Four times a day (QID) | INTRAMUSCULAR | Status: DC | PRN
  Administered 2016-04-04: 8 mg via INTRAVENOUS
  Filled 2016-04-04 (×2): qty 4

## 2016-04-04 NOTE — Progress Notes (Signed)
Attempt by cwicker,rnc for iv x1 in right hand and x1 in right antecubital. Attempy for IV by danielle simpson,rn x1 in left hand.  All attempts with 22 gauge. Lonell Grandchild. Stallings,RNC house coverage RN called and attempt x1 in left hand with 20gauge. x1 in right antecubital with 22 gauge, and x1 in right arm with 22 gauge. CRNA called to come evaluate for IV.

## 2016-04-04 NOTE — MAU Note (Signed)
EMS.  Is having complications with her preg.  Lower back is killing her. Lower abd is killing her.  [facility] They told her that her kidneys were failing her because she can't eat or drink no water. Throwing up all the time. Can't hold nothing down.  (EMS was unable to find a vein for IV fluids). Having vag bleeding. Is bright red, not that  Heavy- only when she wipes.
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
## 2016-04-05 DIAGNOSIS — O210 Mild hyperemesis gravidarum: Secondary | ICD-10-CM | POA: Diagnosis not present

## 2016-04-05 NOTE — Discharge Instructions (Signed)
Eating Plan for Hyperemesis Gravidarum °Severe cases of hyperemesis gravidarum can lead to dehydration and malnutrition. The hyperemesis eating plan is one way to lessen the symptoms of nausea and vomiting. It is often used with prescribed medicines to control your symptoms.  °WHAT CAN I DO TO RELIEVE MY SYMPTOMS? °Listen to your body. Everyone is different and has different preferences. Find what works best for you. Some of the following things may help: °· Eat and drink slowly. °· Eat 5-6 small meals daily instead of 3 large meals.   °· Eat crackers before you get out of bed in the morning.   °· Starchy foods are usually well tolerated (such as cereal, toast, bread, potatoes, pasta, rice, and pretzels).   °· Ginger may help with nausea. Add ¼ tsp ground ginger to hot tea or choose ginger tea.   °· Try drinking 100% fruit juice or an electrolyte drink. °· Continue to take your prenatal vitamins as directed by your health care provider. If you are having trouble taking your prenatal vitamins, talk with your health care provider about different options. °· Include at least 1 serving of protein with your meals and snacks (such as meats or poultry, beans, nuts, eggs, or yogurt). Try eating a protein-rich snack before bed (such as cheese and crackers or a half turkey or peanut butter sandwich). °WHAT THINGS SHOULD I AVOID TO REDUCE MY SYMPTOMS? °The following things may help reduce your symptoms: °· Avoid foods with strong smells. Try eating meals in well-ventilated areas that are free of odors. °· Avoid drinking water or other beverages with meals. Try not to drink anything less than 30 minutes before and after meals. °· Avoid drinking more than 1 cup of fluid at a time. °· Avoid fried or high-fat foods, such as butter and cream sauces. °· Avoid spicy foods. °· Avoid skipping meals the best you can. Nausea can be more intense on an empty stomach. If you cannot tolerate food at that time, do not force it. Try sucking on  ice chips or other frozen items and make up the calories later. °· Avoid lying down within 2 hours after eating. °  °This information is not intended to replace advice given to you by your health care provider. Make sure you discuss any questions you have with your health care provider. °  °Document Released: 05/21/2007 Document Revised: 07/29/2013 Document Reviewed: 05/28/2013 °Elsevier Interactive Patient Education ©2016 [employer] ° °

## 2016-04-07 ENCOUNTER — Encounter (HOSPITAL_COMMUNITY): Payer: Self-pay | Admitting: *Deleted

## 2016-04-07 ENCOUNTER — Inpatient Hospital Stay (HOSPITAL_COMMUNITY)
Admission: AD | Admit: 2016-04-07 | Discharge: 2016-04-08 | Disposition: A | Discharge status: Home / Self Care | Payer: Medicaid Other | Source: Ambulatory Visit | Attending: Obstetrics & Gynecology | Admitting: Obstetrics & Gynecology

## 2016-04-07 DIAGNOSIS — Z87891 Personal history of nicotine dependence: Secondary | ICD-10-CM | POA: Insufficient documentation

## 2016-04-07 DIAGNOSIS — R42 Dizziness and giddiness: Secondary | ICD-10-CM | POA: Insufficient documentation

## 2016-04-07 DIAGNOSIS — K117 Disturbances of salivary secretion: Secondary | ICD-10-CM | POA: Diagnosis not present

## 2016-04-07 DIAGNOSIS — E876 Hypokalemia: Secondary | ICD-10-CM | POA: Insufficient documentation

## 2016-04-07 DIAGNOSIS — Z3A15 15 weeks gestation of pregnancy: Secondary | ICD-10-CM | POA: Insufficient documentation

## 2016-04-07 DIAGNOSIS — O219 Vomiting of pregnancy, unspecified: Secondary | ICD-10-CM | POA: Insufficient documentation

## 2016-04-07 DIAGNOSIS — O26892 Other specified pregnancy related conditions, second trimester: Secondary | ICD-10-CM | POA: Diagnosis not present

## 2016-04-07 DIAGNOSIS — Z91199 Patient's noncompliance with other medical treatment and regimen due to unspecified reason: Secondary | ICD-10-CM

## 2016-04-07 DIAGNOSIS — Z9119 Patient's noncompliance with other medical treatment and regimen: Secondary | ICD-10-CM

## 2016-04-07 DIAGNOSIS — O99612 Diseases of the digestive system complicating pregnancy, second trimester: Secondary | ICD-10-CM | POA: Diagnosis not present

## 2016-04-07 DIAGNOSIS — O210 Mild hyperemesis gravidarum: Secondary | ICD-10-CM

## 2016-04-07 LAB — URINALYSIS, ROUTINE W REFLEX MICROSCOPIC
GLUCOSE, UA: 100 mg/dL — AB
Ketones, ur: >80 mg/dL — AB
LEUKOCYTES UA: NEGATIVE
Nitrite: NEGATIVE
PROTEIN: 100 mg/dL — AB
SPECIFIC GRAVITY, URINE: 1.025 (ref 1.005–1.030)
pH: 7 (ref 5.0–8.0)

## 2016-04-07 LAB — URINE MICROSCOPIC-ADD ON

## 2016-04-07 MED ORDER — LACTATED RINGERS IV BOLUS (SEPSIS): 1000.0000 mL | Freq: Once | INTRAVENOUS | Status: DC

## 2016-04-07 MED ORDER — PROMETHAZINE HCL 25 MG/ML IJ SOLN
25.0000 mg | Freq: Once | INTRAMUSCULAR | Status: AC
Start: 2016-04-07 — End: 2016-04-07
  Administered 2016-04-07: 25 mg via INTRAVENOUS
  Filled 2016-04-07: qty 1

## 2016-04-07 MED ORDER — ONDANSETRON HCL 4 MG/2ML IJ SOLN
4.0000 mg | Freq: Once | INTRAMUSCULAR | Status: AC
  Administered 2016-04-08: 4 mg via INTRAVENOUS
  Filled 2016-04-07: qty 2

## 2016-04-07 MED ORDER — POTASSIUM CHLORIDE 2 MEQ/ML IV SOLN
Freq: Once | INTRAVENOUS | Status: AC
  Administered 2016-04-07: 22:00:00 via INTRAVENOUS
  Filled 2016-04-07: qty 1000

## 2016-04-07 MED ORDER — PROMETHAZINE HCL 25 MG/ML IJ SOLN
25.0000 mg | Freq: Once | INTRAMUSCULAR | Status: DC
  Filled 2016-04-07: qty 1

## 2016-04-07 MED ORDER — POTASSIUM CHLORIDE CRYS ER 20 MEQ PO TBCR
20.0000 meq | EXTENDED_RELEASE_TABLET | Freq: Once | ORAL | Status: AC
  Administered 2016-04-08: 20 meq via ORAL
  Filled 2016-04-07: qty 1

## 2016-04-07 MED ORDER — LACTATED RINGERS IV BOLUS (SEPSIS)
1000.0000 mL | Freq: Once | INTRAVENOUS | Status: AC
  Administered 2016-04-08: 1000 mL via INTRAVENOUS

## 2016-04-07 NOTE — MAU Note (Signed)
Lightheaded, dizzy, weak, and cannot keep anything down entire pregnancy. No meds at home for nausea.
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
## 2016-04-08 DIAGNOSIS — O210 Mild hyperemesis gravidarum: Secondary | ICD-10-CM

## 2016-04-08 DIAGNOSIS — E876 Hypokalemia: Secondary | ICD-10-CM | POA: Diagnosis not present

## 2016-04-08 MED ORDER — POTASSIUM CHLORIDE ER 10 MEQ PO TBCR: 20.0000 meq | EXTENDED_RELEASE_TABLET | Freq: Every day | ORAL | 0 refills | Status: DC

## 2016-04-08 MED ORDER — ONDANSETRON 4 MG PO TBDP: 4.0000 mg | ORAL_TABLET | Freq: Three times a day (TID) | ORAL | 1 refills | Status: DC | PRN

## 2016-04-08 MED ORDER — METOCLOPRAMIDE HCL 5 MG PO TABS
5.0000 mg | ORAL_TABLET | Freq: Three times a day (TID) | ORAL | 0 refills | Status: DC
Start: 2016-04-08 — End: 2016-06-07

## 2016-04-08 MED ORDER — PROMETHAZINE HCL 25 MG RE SUPP: 25.0000 mg | Freq: Every evening | RECTAL | 2 refills | Status: DC | PRN

## 2016-04-08 NOTE — Discharge Instructions (Signed)

## 2016-04-13 ENCOUNTER — Ambulatory Visit (INDEPENDENT_AMBULATORY_CARE_PROVIDER_SITE_OTHER): Payer: Medicaid Other

## 2016-04-13 ENCOUNTER — Ambulatory Visit (INDEPENDENT_AMBULATORY_CARE_PROVIDER_SITE_OTHER): Payer: Medicaid Other | Admitting: Obstetrics & Gynecology

## 2016-04-13 ENCOUNTER — Encounter: Payer: Self-pay | Admitting: *Deleted

## 2016-04-13 VITALS — BP 110/70 | HR 92 | Temp 98.8°F | Wt 177.0 lb

## 2016-04-13 DIAGNOSIS — IMO0002 Reserved for concepts with insufficient information to code with codable children: Secondary | ICD-10-CM | POA: Insufficient documentation

## 2016-04-13 DIAGNOSIS — O3680X0 Pregnancy with inconclusive fetal viability, not applicable or unspecified: Secondary | ICD-10-CM

## 2016-04-13 DIAGNOSIS — O021 Missed abortion: Secondary | ICD-10-CM

## 2016-04-13 NOTE — Progress Notes (Signed)
   PRENATAL VISIT NOTE  Subjective:  Cristian Carbonneau is a 35 y.o. G4P2012 at [redacted]w[redacted]d being seen today for ongoing prenatal care.  She is currently monitored for the following issues for this low-risk pregnancy and has Hyperemesis gravidarum; Hypokalemia; and Fetal demise at [redacted]w[redacted]d on her problem list. Has multiple ED visits for HEG, hypokalemia and recent admission at [facility].   Patient reports no complaints.  Contractions: Not present. Vag. Bleeding: None.  Movement: Absent. Denies leaking of fluid.   The following portions of the patient's history were reviewed and updated as appropriate: allergies, current medications, past family history, past medical history, past social history, past surgical history and problem list. Problem list updated.  Objective:   Vitals:   04/13/16 0931  BP: 110/70  Pulse: 92  Temp: 98.8 F (37.1 C)  Weight: 177 lb (80.3 kg)    Fetal Status:     Movement: Absent     Unable to auscultate FHR on doppler  General:  Alert, oriented and cooperative. Patient is in no acute distress.  Skin: Skin is warm and dry. No rash noted.   Cardiovascular: Normal heart rate noted  Respiratory: Normal respiratory effort, no problems with respiration noted  Abdomen: Soft, gravid, appropriate for gestational age. Pain/Pressure: Present     Pelvic:  Cervical exam deferred        Extremities: Normal range of motion.  Edema: Trace  Mental Status: Normal mood and affect. Normal behavior. Normal judgment and thought content.   Urinalysis: Urine Protein: Trace Urine Glucose: 4+ Bedside scan: Unable to visualize fetal cardiac motion or body movement. Formal scan ordered Formal scan at [facility]: IUFD. Fetal ascites present.  Assessment and Plan:  Pregnancy: G4P2012 at [redacted]w[redacted]d  Fetal demise, less than 22 weeks Patient informed of diagnosis. Appropriate support given to patient.  Discussed options of IOL and D&E, risks/benefits reviewed in detail. All questions answered. Patient is leaning  towards IOL, desires to see an "intact baby".  Will call [facility] and arrange for this to occur tomorrow. Patient understands there is still a risk of needing a D&E for retained placenta or hemorrhage.    Kourtnei Rauber A Tylan Kinn, MD  

## 2016-04-14 ENCOUNTER — Encounter (HOSPITAL_COMMUNITY): Payer: Self-pay | Admitting: *Deleted

## 2016-04-18 MED ORDER — DOXYCYCLINE HYCLATE 100 MG IV SOLR
200.0000 mg | INTRAVENOUS | Status: AC
  Administered 2016-04-19: 200 mg via INTRAVENOUS
  Filled 2016-04-18: qty 200

## 2016-04-19 ENCOUNTER — Ambulatory Visit (HOSPITAL_COMMUNITY): Payer: Medicaid Other | Admitting: Anesthesiology

## 2016-04-19 ENCOUNTER — Encounter (HOSPITAL_COMMUNITY): Payer: Self-pay | Admitting: *Deleted

## 2016-04-19 ENCOUNTER — Ambulatory Visit (HOSPITAL_COMMUNITY)
Admission: RE | Admit: 2016-04-19 | Discharge: 2016-04-19 | Disposition: A | Discharge status: Home / Self Care | Payer: Medicaid Other | Source: Ambulatory Visit | Attending: Obstetrics & Gynecology | Admitting: Obstetrics & Gynecology

## 2016-04-19 ENCOUNTER — Encounter (HOSPITAL_COMMUNITY)
Admission: RE | Disposition: A | Discharge status: Home / Self Care | Payer: Self-pay | Source: Ambulatory Visit | Attending: Obstetrics & Gynecology

## 2016-04-19 ENCOUNTER — Ambulatory Visit (HOSPITAL_COMMUNITY): Payer: Medicaid Other

## 2016-04-19 DIAGNOSIS — IMO0002 Reserved for concepts with insufficient information to code with codable children: Secondary | ICD-10-CM

## 2016-04-19 DIAGNOSIS — Z3A16 16 weeks gestation of pregnancy: Secondary | ICD-10-CM | POA: Diagnosis not present

## 2016-04-19 DIAGNOSIS — O021 Missed abortion: Secondary | ICD-10-CM | POA: Diagnosis not present

## 2016-04-19 HISTORY — DX: Gastro-esophageal reflux disease without esophagitis: K21.9

## 2016-04-19 HISTORY — PX: DILATION AND EVACUATION: SHX1459

## 2016-04-19 HISTORY — DX: Headache, unspecified: R51.9

## 2016-04-19 HISTORY — DX: Headache: R51

## 2016-04-19 HISTORY — DX: Hypokalemia: E87.6

## 2016-04-19 LAB — CBC
HEMATOCRIT: 30.6 % — AB (ref 36.0–46.0)
Hemoglobin: 10.1 g/dL — ABNORMAL LOW (ref 12.0–15.0)
MCH: 26.5 pg (ref 26.0–34.0)
MCHC: 33 g/dL (ref 30.0–36.0)
MCV: 80.3 fL (ref 78.0–100.0)
Platelets: 283 10*3/uL (ref 150–400)
RBC: 3.81 MIL/uL — AB (ref 3.87–5.11)
RDW: 18.4 % — ABNORMAL HIGH (ref 11.5–15.5)
WBC: 9.4 10*3/uL (ref 4.0–10.5)

## 2016-04-19 SURGERY — DILATION AND EVACUATION, UTERUS
Anesthesia: General | Site: Vagina

## 2016-04-19 MED ORDER — SUCCINYLCHOLINE CHLORIDE 20 MG/ML IJ SOLN
INTRAMUSCULAR | Status: DC | PRN
  Administered 2016-04-19: 100 mg via INTRAVENOUS

## 2016-04-19 MED ORDER — LIDOCAINE HCL (CARDIAC) 20 MG/ML IV SOLN
INTRAVENOUS | Status: DC | PRN
  Administered 2016-04-19: 50 mg via INTRAVENOUS

## 2016-04-19 MED ORDER — DEXAMETHASONE SODIUM PHOSPHATE 10 MG/ML IJ SOLN
INTRAMUSCULAR | Status: DC | PRN
  Administered 2016-04-19: 10 mg via INTRAVENOUS

## 2016-04-19 MED ORDER — PROMETHAZINE HCL 25 MG/ML IJ SOLN: 6.2500 mg | INTRAMUSCULAR | Status: DC | PRN

## 2016-04-19 MED ORDER — METHYLERGONOVINE MALEATE 0.2 MG/ML IJ SOLN
INTRAMUSCULAR | Status: AC
  Filled 2016-04-19: qty 1

## 2016-04-19 MED ORDER — FENTANYL CITRATE (PF) 100 MCG/2ML IJ SOLN
INTRAMUSCULAR | Status: AC
  Filled 2016-04-19: qty 2

## 2016-04-19 MED ORDER — LIDOCAINE HCL (CARDIAC) 20 MG/ML IV SOLN
INTRAVENOUS | Status: AC
  Filled 2016-04-19: qty 5

## 2016-04-19 MED ORDER — LACTATED RINGERS IV SOLN
INTRAVENOUS | Status: DC
  Administered 2016-04-19: 10:00:00 via INTRAVENOUS

## 2016-04-19 MED ORDER — BUPIVACAINE HCL 0.5 % IJ SOLN
INTRAMUSCULAR | Status: DC | PRN
  Administered 2016-04-19: 30 mL

## 2016-04-19 MED ORDER — FENTANYL CITRATE (PF) 100 MCG/2ML IJ SOLN
INTRAMUSCULAR | Status: DC | PRN
  Administered 2016-04-19: 100 ug via INTRAVENOUS

## 2016-04-19 MED ORDER — OXYCODONE-ACETAMINOPHEN 5-325 MG PO TABS: 1.0000 | ORAL_TABLET | Freq: Four times a day (QID) | ORAL | 0 refills | Status: DC | PRN

## 2016-04-19 MED ORDER — ONDANSETRON HCL 4 MG/2ML IJ SOLN
INTRAMUSCULAR | Status: DC | PRN
  Administered 2016-04-19: 4 mg via INTRAVENOUS

## 2016-04-19 MED ORDER — MIDAZOLAM HCL 2 MG/2ML IJ SOLN
INTRAMUSCULAR | Status: DC | PRN
  Administered 2016-04-19: 2 mg via INTRAVENOUS

## 2016-04-19 MED ORDER — FENTANYL CITRATE (PF) 100 MCG/2ML IJ SOLN: 25.0000 ug | INTRAMUSCULAR | Status: DC | PRN

## 2016-04-19 MED ORDER — BUPIVACAINE HCL (PF) 0.5 % IJ SOLN
INTRAMUSCULAR | Status: AC
  Filled 2016-04-19: qty 30

## 2016-04-19 MED ORDER — ONDANSETRON HCL 4 MG/2ML IJ SOLN
INTRAMUSCULAR | Status: AC
  Filled 2016-04-19: qty 2

## 2016-04-19 MED ORDER — METHYLERGONOVINE MALEATE 0.2 MG/ML IJ SOLN
INTRAMUSCULAR | Status: DC | PRN
  Administered 2016-04-19: 0.2 mg via INTRAMUSCULAR

## 2016-04-19 MED ORDER — PROPOFOL 10 MG/ML IV BOLUS
INTRAVENOUS | Status: DC | PRN
  Administered 2016-04-19: 150 mg via INTRAVENOUS

## 2016-04-19 MED ORDER — SCOPOLAMINE 1 MG/3DAYS TD PT72
1.0000 | MEDICATED_PATCH | Freq: Once | TRANSDERMAL | Status: DC
  Administered 2016-04-19: 1.5 mg via TRANSDERMAL

## 2016-04-19 MED ORDER — MIDAZOLAM HCL 2 MG/2ML IJ SOLN
INTRAMUSCULAR | Status: AC
  Filled 2016-04-19: qty 2

## 2016-04-19 MED ORDER — DOCUSATE SODIUM 100 MG PO CAPS: 100.0000 mg | ORAL_CAPSULE | Freq: Two times a day (BID) | ORAL | 2 refills | Status: DC | PRN

## 2016-04-19 MED ORDER — IBUPROFEN 600 MG PO TABS: 600.0000 mg | ORAL_TABLET | Freq: Four times a day (QID) | ORAL | 3 refills | Status: DC | PRN

## 2016-04-19 MED ORDER — SCOPOLAMINE 1 MG/3DAYS TD PT72
MEDICATED_PATCH | TRANSDERMAL | Status: AC
  Filled 2016-04-19: qty 1

## 2016-04-19 MED ORDER — PROPOFOL 10 MG/ML IV BOLUS
INTRAVENOUS | Status: AC
  Filled 2016-04-19: qty 20

## 2016-04-19 MED ORDER — LACTATED RINGERS IV SOLN: INTRAVENOUS | Status: DC

## 2016-04-19 SURGICAL SUPPLY — 21 items
CATH ROBINSON RED A/P 16FR (CATHETERS) ×3 IMPLANT
CLOTH BEACON ORANGE TIMEOUT ST (SAFETY) ×3 IMPLANT
DECANTER SPIKE VIAL GLASS SM (MISCELLANEOUS) ×3 IMPLANT
GLOVE BIOGEL PI IND STRL 7.0 (GLOVE) ×3 IMPLANT
GLOVE BIOGEL PI INDICATOR 7.0 (GLOVE) ×6
GLOVE ECLIPSE 7.0 STRL STRAW (GLOVE) ×3 IMPLANT
GOWN STRL REUS W/TWL LRG LVL3 (GOWN DISPOSABLE) ×6 IMPLANT
KIT BERKELEY 1ST TRIMESTER 3/8 (MISCELLANEOUS) ×3 IMPLANT
NS IRRIG 1000ML POUR BTL (IV SOLUTION) ×3 IMPLANT
PACK VAGINAL MINOR WOMEN LF (CUSTOM PROCEDURE TRAY) ×3 IMPLANT
PAD OB MATERNITY 4.3X12.25 (PERSONAL CARE ITEMS) ×3 IMPLANT
PAD PREP 24X48 CUFFED NSTRL (MISCELLANEOUS) ×3 IMPLANT
SET BERKELEY SUCTION TUBING (SUCTIONS) IMPLANT
TOWEL OR 17X24 6PK STRL BLUE (TOWEL DISPOSABLE) ×6 IMPLANT
TUBE VACURETTE 2ND TRIMESTER (CANNULA) ×3 IMPLANT
VACURETTE 10 RIGID CVD (CANNULA) IMPLANT
VACURETTE 16MM ASPIR CVD .5 (CANNULA) ×3 IMPLANT
VACURETTE 6 ASPIR F TIP BERK (CANNULA) IMPLANT
VACURETTE 7MM CVD STRL WRAP (CANNULA) IMPLANT
VACURETTE 8 RIGID CVD (CANNULA) IMPLANT
VACURETTE 9 RIGID CVD (CANNULA) IMPLANT

## 2016-04-19 NOTE — Interval H&P Note (Signed)
History and Physical Interval Note 04/19/2016 9:10 AM  Erica [name] was diagnosed with fetal demise at 6284w4d on 04/13/16 and had initially opted for IOL for management. She changed her mind the day after last enocunter, and desired surgical management with dilation and evacuation.  She has presented today for surgery, with the diagnosis of second trimester fetal demise.   The various methods of treatment have been discussed with the patient.  Risks of surgery including bleeding, infection, injury to surrounding organs, need for additional procedures, possibility of intrauterine scarring which may impair future fertility, risk of retained products which may require further management and other postoperative/anesthesia complications were explained to patient.  Likelihood of success of complete evacuation of the uterus was discussed with the patient.  After consideration of risks, benefits and other options for treatment, the patient has consented to  Procedure(s): DILATATION AND EVACUATION WITH ULTRASOUND GUIDANCE (N/A) as a surgical intervention .  The patient's history has been reviewed, patient examined, no change in status, stable for surgery.  I have reviewed the patient's chart and labs.  Questions were answered to the patient's satisfaction.  Of note, patient requested ultrasound in preoperative area confirming demise again, this was done for the patient and demise was confirmed to her satisfaction.  Preoperative prophylactic Doxycycline 200mg  IV  has been ordered and is on call to the OR.  To OR when ready.   Jaynie CollinsUGONNA  Amyre Segundo, MD, FACOG Attending Obstetrician & Gynecologist, [facility] for [facility], [facility]

## 2016-04-19 NOTE — Op Note (Signed)
Erica [name] PROCEDURE DATE:  04/19/2016  PREOPERATIVE DIAGNOSIS: Intrauterine fetal demise at 2537w4d POSTOPERATIVE DIAGNOSIS: The same PROCEDURE:  Dilation and Evacuation under ultrasound guidance SURGEON:  Dr. Jaynie CollinsUgonna Kieryn Burtis  INDICATIONS: 35 y.o.  [id number] with intrauterine fetal demise at 1337w4d who desires surgical management.  Risks of surgery were discussed with the patient and her friend including but not limited to: bleeding which may require transfusion; infection which may require antibiotics; injury to uterus or surrounding organs; need for additional procedures including laparotomy or laparoscopy; possibility of intrauterine scarring which may impair future fertility; and other postoperative/anesthesia complications. Written informed consent was obtained.  FINDINGS:   Intrauterine fetal demise at 10841w3d. Significant amount of products of conception.  Empty endometrial stripe noted on ultrasound at the end of the procedure.   ANESTHESIA:    General, paracervical block with 30 ml of 0.5% Marcaine INTRAVENOUS FLUIDS:  1000 ml of LR ESTIMATED BLOOD LOSS:  50 ml. SPECIMENS:  Products of conception sent to pathology COMPLICATIONS:  None immediate.  PROCEDURE DETAILS:  The patient received intravenous Doxycycline while in the preoperative area.  She was then taken to the operating room where monitored intravenous sedation was administered and was found to be adequate.  After an adequate timeout was performed, she was placed in the dorsal lithotomy position and examined; then prepped and draped in the sterile manner.   Her bladder was catheterized for an unmeasured amount of clear, yellow urine. A vaginal speculum was then placed in the patient's vagina and a single tooth tenaculum was applied to the anterior lip of the cervix.  A paracervical block using 30 ml of 0.5% Marcaine was administered. The cervix was gently dilated under ultrasound guidance to accommodate a 16 mm suction curette that  was gently advanced to the uterine fundus.  The suction device was then activated and curette slowly rotated to clear the uterus of products of conception.  A sharp curettage was then performed to confirm complete emptying of the uterus. Methergine 0.2 mg IM was administered to help prevent bleeding. There was minimal bleeding noted at the end of the procedure, and the tenaculum removed with good hemostasis noted.   All instruments were removed from the patient's vagina.  Sponge and instrument counts were correct times three.    The patient tolerated the procedure well and was taken to the recovery area awake, extubated and in stable condition.  The patient will be discharged to home as per PACU criteria.  Routine postoperative instructions given.  She was prescribed Percocet, Ibuprofen and Colace.  She will follow up in the clinic in 2-3 weeks for postoperative evaluation.    Jaynie CollinsUGONNA  Erica Caison, MD, FACOG Attending Obstetrician & Gynecologist Faculty Practice, [facility]

## 2016-04-19 NOTE — Discharge Instructions (Signed)
Dilation and Curettage or Vacuum Curettage, Care After °Refer to this sheet in the next few weeks. These instructions provide you with information on caring for yourself after your procedure. Your health care provider may also give you more specific instructions. Your treatment has been planned according to current medical practices, but problems sometimes occur. Call your health care provider if you have any problems or questions after your procedure. °WHAT TO EXPECT AFTER THE PROCEDURE °After your procedure, it is typical to have light cramping and bleeding. This may last for 2 days to 2 weeks after the procedure. °HOME CARE INSTRUCTIONS  °· Do not drive for 24 hours. °· Wait 1 week before returning to strenuous activities. °· Take your temperature 2 times a day for 4 days and write it down. Provide these temperatures to your health care provider if you develop a fever. °· Avoid long periods of standing. °· Avoid heavy lifting, pushing, or pulling. Do not lift anything heavier than 10 pounds (4.5 kg). °· Limit stair climbing to once or twice a day. °· Take rest periods often. °· You may resume your usual diet. °· Drink enough fluids to keep your urine clear or pale yellow. °· Your usual bowel function should return. If you have constipation, you may: °¨ Take a mild laxative with permission from your health care provider. °¨ Add fruit and bran to your diet. °¨ Drink more fluids. °· Take showers instead of baths until your health care provider gives you permission to take baths. °· Do not go swimming or use a hot tub until your health care provider approves. °· Try to have someone with you or available to you the first 24-48 hours, especially if you were given a general anesthetic. °· Do not douche, use tampons, or have sex (intercourse) for 2 weeks after the procedure. °· Only take over-the-counter or prescription medicines as directed by your health care provider. Do not take aspirin. It can cause  bleeding. °· Follow up with your health care provider as directed. °SEEK MEDICAL CARE IF:  °· You have increasing cramps or pain that is not relieved with medicine. °· You have abdominal pain that does not seem to be related to the same area of earlier cramping and pain. °· You have bad smelling vaginal discharge. °· You have a rash. °· You are having problems with any medicine. °SEEK IMMEDIATE MEDICAL CARE IF:  °· You have bleeding that is heavier than a normal menstrual period. °· You have a fever. °· You have chest pain. °· You have shortness of breath. °· You feel dizzy or feel like fainting. °· You pass out. °· You have pain in your shoulder strap area. °· You have heavy vaginal bleeding with or without blood clots. °MAKE SURE YOU:  °· Understand these instructions. °· Will watch your condition. °· Will get help right away if you are not doing well or get worse. °  °This information is not intended to replace advice given to you by your health care provider. Make sure you discuss any questions you have with your health care provider. °  °Document Released: 07/21/2000 Document Revised: 07/29/2013 Document Reviewed: 02/20/2013 °Elsevier Interactive Patient Education ©2016 Elsevier Inc. ° °Post Anesthesia Home Care Instructions ° °Activity: °Get plenty of rest for the remainder of the day. A responsible adult should stay with you for 24 hours following the procedure.  °For the next 24 hours, DO NOT: °-Drive a car °-Operate machinery °-Drink alcoholic beverages °-Take any medication unless   instructed by your physician °-Make any legal decisions or sign important papers. ° °Meals: °Start with liquid foods such as gelatin or soup. Progress to regular foods as tolerated. Avoid greasy, spicy, heavy foods. If nausea and/or vomiting occur, drink only clear liquids until the nausea and/or vomiting subsides. Call your physician if vomiting continues. ° °Special Instructions/Symptoms: °Your throat may feel dry or sore from  the anesthesia or the breathing tube placed in your throat during surgery. If this causes discomfort, gargle with warm salt water. The discomfort should disappear within 24 hours. ° °If you had a scopolamine patch placed behind your ear for the management of post- operative nausea and/or vomiting: ° °1. The medication in the patch is effective for 72 hours, after which it should be removed.  Wrap patch in a tissue and discard in the trash. Wash hands thoroughly with soap and water. °2. You may remove the patch earlier than 72 hours if you experience unpleasant side effects which may include dry mouth, dizziness or visual disturbances. °3. Avoid touching the patch. Wash your hands with soap and water after contact with the patch. °  ° °

## 2016-04-19 NOTE — H&P (View-Only) (Signed)
   PRENATAL VISIT NOTE  Subjective:  Erica [name] is a 35 y.o. [id number] at 8567w4d being seen today for ongoing prenatal care.  She is currently monitored for the following issues for this low-risk pregnancy and has Hyperemesis gravidarum; Hypokalemia; and Fetal demise at 5667w4d on her problem list. Has multiple ED visits for HEG, hypokalemia and [facility].   Patient reports no complaints.  Contractions: Not present. Vag. Bleeding: None.  Movement: Absent. Denies leaking of fluid.   The following portions of the patient's history were reviewed and updated as appropriate: allergies, current medications, past family history, past medical history, past social history, past surgical history and problem list. Problem list updated.  Objective:   Vitals:   04/13/16 0931  BP: 110/70  Pulse: 92  Temp: 98.8 F (37.1 C)  Weight: 177 lb (80.3 kg)    Fetal Status:     Movement: Absent     Unable to auscultate FHR on doppler  General:  Alert, oriented and cooperative. Patient is in no acute distress.  Skin: Skin is warm and dry. No rash noted.   Cardiovascular: Normal heart rate noted  Respiratory: Normal respiratory effort, no problems with respiration noted  Abdomen: Soft, gravid, appropriate for gestational age. Pain/Pressure: Present     Pelvic:  Cervical exam deferred        Extremities: Normal range of motion.  Edema: Trace  Mental Status: Normal mood and affect. Normal behavior. Normal judgment and thought content.   Urinalysis: Urine Protein: Trace Urine Glucose: 4+ Bedside scan: Unable to visualize fetal cardiac motion or body movement. Formal scan ordered Formal scan at [facility]: IUFD. Fetal ascites present.  Assessment and Plan:  Pregnancy: [id number] at 8967w4d  Fetal demise, less than 22 weeks Patient informed of diagnosis. Appropriate support given to patient.  Discussed options of IOL and D&E, risks/benefits reviewed in detail. All questions answered. Patient is leaning  towards IOL, desires to see an "intact baby".  [facility] and arrange for this to occur tomorrow. Patient understands there is still a risk of needing a D&E for retained placenta or hemorrhage.    Tereso NewcomerUgonna A Denea Cheaney, MD

## 2016-04-19 NOTE — Anesthesia Preprocedure Evaluation (Addendum)
Anesthesia Evaluation  Patient identified by MRN, date of birth, ID band Patient awake    Reviewed: Allergy & Precautions, NPO status , Patient's Chart, lab work & pertinent test results  Airway Mallampati: II  TM Distance: >3 FB Neck ROM: Full    Dental  (+) Teeth Intact, Dental Advisory Given   Pulmonary former smoker,  Bronchitis, inhaler PRN   Pulmonary exam normal breath sounds clear to auscultation       Cardiovascular negative cardio ROS Normal cardiovascular exam Rhythm:Regular Rate:Normal     Neuro/Psych  Headaches, negative psych ROS   GI/Hepatic Neg liver ROS, +Nausea/vomiting   Endo/Other  Obesity   Renal/GU negative Renal ROS     Musculoskeletal negative musculoskeletal ROS (+)   Abdominal   Peds  Hematology  (+) Blood dyscrasia, anemia ,   Anesthesia Other Findings Day of surgery medications reviewed with the patient.  Reproductive/Obstetrics Miscarriage at 17wks                           Anesthesia Physical Anesthesia Plan  ASA: II  Anesthesia Plan: General   Post-op Pain Management:    Induction: Intravenous and Rapid sequence  Airway Management Planned: Oral ETT  Additional Equipment:   Intra-op Plan:   Post-operative Plan: Extubation in OR  Informed Consent: I have reviewed the patients History and Physical, chart, labs and discussed the procedure including the risks, benefits and alternatives for the proposed anesthesia with the patient or authorized representative who has indicated his/her understanding and acceptance.   Dental advisory given  Plan Discussed with: CRNA  Anesthesia Plan Comments: (Risks/benefits of general anesthesia discussed with patient including risk of damage to teeth, lips, gum, and tongue, nausea/vomiting, allergic reactions to medications, and the possibility of heart attack, stroke and death.  All patient questions answered.   Patient wishes to proceed.)       Anesthesia Quick Evaluation

## 2016-04-19 NOTE — Transfer of Care (Signed)
Immediate Anesthesia Transfer of Care Note  Patient: Erica [name]  Procedure(s) Performed: Procedure(s): DILATATION AND EVACUATION WITH ULTRASOUND GUIDANCE (N/A)  Patient Location: PACU  Anesthesia Type:General  Level of Consciousness: awake, alert  and oriented  Airway & Oxygen Therapy: Patient Spontanous Breathing and Patient connected to nasal cannula oxygen  Post-op Assessment: Report given to RN and Post -op Vital signs reviewed and stable  Post vital signs: Reviewed and stable  Last Vitals:  Vitals:   04/19/16 0857  BP: 110/80  Pulse: 82  Resp: 18  Temp: 36.8 C    Last Pain:  Vitals:   04/19/16 0857  TempSrc: Oral  PainSc: 3       Patients Stated Pain Goal: 5 (04/19/16 0857)  Complications: No apparent anesthesia complications

## 2016-04-19 NOTE — Anesthesia Postprocedure Evaluation (Signed)
Anesthesia Post Note  Patient: Erica [name]  Procedure(s) Performed: Procedure(s) (LRB): DILATATION AND EVACUATION WITH ULTRASOUND GUIDANCE (N/A)  Patient location during evaluation: PACU Anesthesia Type: General Level of consciousness: awake Pain management: pain level controlled Vital Signs Assessment: post-procedure vital signs reviewed and stable Respiratory status: spontaneous breathing Cardiovascular status: stable Postop Assessment: no signs of nausea or vomiting Anesthetic complications: no     Last Vitals:  Vitals:   04/19/16 1115 04/19/16 1130  BP: 107/85 106/76  Pulse: 78 74  Resp: 16 16  Temp:  36.6 C    Last Pain:  Vitals:   04/19/16 0857  TempSrc: Oral  PainSc: 3    Pain Goal: Patients Stated Pain Goal: 5 (04/19/16 0857)               Allizon Woznick JR,JOHN Susann GivensFRANKLIN

## 2016-04-20 ENCOUNTER — Encounter (HOSPITAL_COMMUNITY): Payer: Self-pay | Admitting: Obstetrics & Gynecology

## 2016-06-07 ENCOUNTER — Encounter: Payer: Self-pay | Admitting: Obstetrics and Gynecology

## 2016-06-07 ENCOUNTER — Ambulatory Visit (INDEPENDENT_AMBULATORY_CARE_PROVIDER_SITE_OTHER): Payer: Medicaid Other | Admitting: Obstetrics and Gynecology

## 2016-06-07 DIAGNOSIS — Z3042 Encounter for surveillance of injectable contraceptive: Secondary | ICD-10-CM

## 2016-06-07 DIAGNOSIS — F53 Postpartum depression: Secondary | ICD-10-CM

## 2016-06-07 DIAGNOSIS — O99345 Other mental disorders complicating the puerperium: Secondary | ICD-10-CM

## 2016-06-07 MED ORDER — SERTRALINE HCL 50 MG PO TABS
ORAL_TABLET | ORAL | 5 refills | Status: DC
Start: 2016-06-07 — End: 2018-10-30

## 2016-06-07 MED ORDER — MEDROXYPROGESTERONE ACETATE 150 MG/ML IM SUSP: 150.0000 mg | INTRAMUSCULAR | 0 refills | Status: DC

## 2016-06-07 NOTE — Progress Notes (Addendum)
Patient is in the office follow up.   Pt is here for f/u of D & E on 04/19/16 for FDIU at 17 weeks. She reports no bowel or bladder dysfunction. Bleeding had stopped but returned today. Thinks it is her cycle.  She desires BTL for contraception.  She reports coping with the pregnancy loss as best as possible. Still some crying spells. She denies any Homo/Suicidial ideations. PPD screen is elevated today. She does request medication to help   PE AF VSS WDWN female in NAD. Affect appropriate.  Lungs clear Heart RRR Abd soft + BS Pelvic deferred  A/P PPV Desire for BTL Postpartum depression  BTL reviewed. Papers signed today. Information on support group for fetal loss provided to pt. Will start Zoloft today as well. [facility]. Depo Provera for contraception until BTL. Pt to pick up Rx today and return tomorrow for injection. Instructed to reframe from IC x 2 weeks. Will follow up on response to Zoloft at her post op appt.

## 2016-06-07 NOTE — Patient Instructions (Signed)
Postpartum Depression and Baby Blues The postpartum period begins right after the birth of a baby. During this time, there is often a great amount of joy and excitement. It is also a time of many changes in the life of the parents. Regardless of how many times a mother gives birth, each child brings new challenges and dynamics to the family. It is not unusual to have feelings of excitement along with confusing shifts in moods, emotions, and thoughts. All mothers are at risk of developing postpartum depression or the "baby blues." These mood changes can occur right after giving birth, or they may occur many months after giving birth. The baby blues or postpartum depression can be mild or severe. Additionally, postpartum depression can go away rather quickly, or it can be a long-term condition.  CAUSES Raised hormone levels and the rapid drop in those levels are thought to be a main cause of postpartum depression and the baby blues. A number of hormones change during and after pregnancy. Estrogen and progesterone usually decrease right after the delivery of your baby. The levels of thyroid hormone and various cortisol steroids also rapidly drop. Other factors that play a role in these mood changes include major life events and genetics.  RISK FACTORS If you have any of the following risks for the baby blues or postpartum depression, know what symptoms to watch out for during the postpartum period. Risk factors that may increase the likelihood of getting the baby blues or postpartum depression include:  Having a personal or family history of depression.   Having depression while being pregnant.   Having premenstrual mood issues or mood issues related to oral contraceptives.  Having a lot of life stress.   Having marital conflict.   Lacking a social support network.   Having a baby with special needs.   Having health problems, such as diabetes.  SIGNS AND SYMPTOMS Symptoms of baby blues  include:  Brief changes in mood, such as going from extreme happiness to sadness.  Decreased concentration.   Difficulty sleeping.   Crying spells, tearfulness.   Irritability.   Anxiety.  Symptoms of postpartum depression typically begin within the first month after giving birth. These symptoms include:  Difficulty sleeping or excessive sleepiness.   Marked weight loss.   Agitation.   Feelings of worthlessness.   Lack of interest in activity or food.  Postpartum psychosis is a very serious condition and can be dangerous. Fortunately, it is rare. Displaying any of the following symptoms is cause for immediate medical attention. Symptoms of postpartum psychosis include:   Hallucinations and delusions.   Bizarre or disorganized behavior.   Confusion or disorientation.  DIAGNOSIS  A diagnosis is made by an evaluation of your symptoms. There are no medical or lab tests that lead to a diagnosis, but there are various questionnaires that a health care provider may use to identify those with the baby blues, postpartum depression, or psychosis. Often, a screening tool called the Edinburgh Postnatal Depression Scale is used to diagnose depression in the postpartum period.  TREATMENT The baby blues usually goes away on its own in 1-2 weeks. Social support is often all that is needed. You will be encouraged to get adequate sleep and rest. Occasionally, you may be given medicines to help you sleep.  Postpartum depression requires treatment because it can last several months or longer if it is not treated. Treatment may include individual or group therapy, medicine, or both to address any social, physiological, and psychological   factors that may play a role in the depression. Regular exercise, a healthy diet, rest, and social support may also be strongly recommended.  Postpartum psychosis is more serious and needs treatment right away. Hospitalization is often needed. HOME CARE  INSTRUCTIONS  Get as much rest as you can. Nap when the baby sleeps.   Exercise regularly. Some women find yoga and walking to be beneficial.   Eat a balanced and nourishing diet.   Do little things that you enjoy. Have a cup of tea, take a bubble bath, read your favorite magazine, or listen to your favorite music.  Avoid alcohol.   Ask for help with household chores, cooking, grocery shopping, or running errands as needed. Do not try to do everything.   Talk to people close to you about how you are feeling. Get support from your partner, family members, friends, or other new moms.  Try to stay positive in how you think. Think about the things you are grateful for.   Do not spend a lot of time alone.   Only take over-the-counter or prescription medicine as directed by your health care provider.  Keep all your postpartum appointments.   Let your health care provider know if you have any concerns.  SEEK MEDICAL CARE IF: You are having a reaction to or problems with your medicine. SEEK IMMEDIATE MEDICAL CARE IF:  You have suicidal feelings.   You think you may harm the baby or someone else. MAKE SURE YOU:  Understand these instructions.  Will watch your condition.  Will get help right away if you are not doing well or get worse.   This information is not intended to replace advice given to you by your health care provider. Make sure you discuss any questions you have with your health care provider.   Document Released: 04/27/2004 Document Revised: 07/29/2013 Document Reviewed: 05/05/2013 Elsevier Interactive Patient Education 2016 Elsevier Inc. Medroxyprogesterone injection [Contraceptive] What is this medicine? MEDROXYPROGESTERONE (me DROX ee proe JES te rone) contraceptive injections prevent pregnancy. They provide effective birth control for 3 months. Depo-subQ Provera 104 is also used for treating pain related to endometriosis. This medicine may be used for  other purposes; ask your health care provider or pharmacist if you have questions. What should I tell my health care provider before I take this medicine? They need to know if you have any of these conditions: -frequently drink alcohol -asthma -blood vessel disease or a history of a blood clot in the lungs or legs -bone disease such as osteoporosis -breast cancer -diabetes -eating disorder (anorexia nervosa or bulimia) -high blood pressure -HIV infection or AIDS -kidney disease -liver disease -mental depression -migraine -seizures (convulsions) -stroke -tobacco smoker -vaginal bleeding -an unusual or allergic reaction to medroxyprogesterone, other hormones, medicines, foods, dyes, or preservatives -pregnant or trying to get pregnant -breast-feeding How should I use this medicine? Depo-Provera Contraceptive injection is given into a muscle. Depo-subQ Provera 104 injection is given under the skin. These injections are given by a health care professional. You must not be pregnant before getting an injection. The injection is usually given during the first 5 days after the start of a menstrual period or 6 weeks after delivery of a baby. Talk to your pediatrician regarding the use of this medicine in children. Special care may be needed. These injections have been used in female children who have started having menstrual periods. Overdosage: If you think you have taken too much of this medicine contact a poison control center  or emergency room at once. NOTE: This medicine is only for you. Do not share this medicine with others. What if I miss a dose? Try not to miss a dose. You must get an injection once every 3 months to maintain birth control. If you cannot keep an appointment, call and reschedule it. If you wait longer than 13 weeks between Depo-Provera contraceptive injections or longer than 14 weeks between Depo-subQ Provera 104 injections, you could get pregnant. Use another method for  birth control if you miss your appointment. You may also need a pregnancy test before receiving another injection. What may interact with this medicine? Do not take this medicine with any of the following medications: -bosentan This medicine may also interact with the following medications: -aminoglutethimide -antibiotics or medicines for infections, especially rifampin, rifabutin, rifapentine, and griseofulvin -aprepitant -barbiturate medicines such as phenobarbital or primidone -bexarotene -carbamazepine -medicines for seizures like ethotoin, felbamate, oxcarbazepine, phenytoin, topiramate -modafinil -St. John's wort This list may not describe all possible interactions. Give your health care provider a list of all the medicines, herbs, non-prescription drugs, or dietary supplements you use. Also tell them if you smoke, drink alcohol, or use illegal drugs. Some items may interact with your medicine. What should I watch for while using this medicine? This drug does not protect you against HIV infection (AIDS) or other sexually transmitted diseases. Use of this product may cause you to lose calcium from your bones. Loss of calcium may cause weak bones (osteoporosis). Only use this product for more than 2 years if other forms of birth control are not right for you. The longer you use this product for birth control the more likely you will be at risk for weak bones. Ask your health care professional how you can keep strong bones. You may have a change in bleeding pattern or irregular periods. Many females stop having periods while taking this drug. If you have received your injections on time, your chance of being pregnant is very low. If you think you may be pregnant, see your health care professional as soon as possible. Tell your health care professional if you want to get pregnant within the next year. The effect of this medicine may last a long time after you get your last injection. What side  effects may I notice from receiving this medicine? Side effects that you should report to your doctor or health care professional as soon as possible: -allergic reactions like skin rash, itching or hives, swelling of the face, lips, or tongue -breast tenderness or discharge -breathing problems -changes in vision -depression -feeling faint or lightheaded, falls -fever -pain in the abdomen, chest, groin, or leg -problems with balance, talking, walking -unusually weak or tired -yellowing of the eyes or skin Side effects that usually do not require medical attention (report to your doctor or health care professional if they continue or are bothersome): -acne -fluid retention and swelling -headache -irregular periods, spotting, or absent periods -temporary pain, itching, or skin reaction at site where injected -weight gain This list may not describe all possible side effects. Call your doctor for medical advice about side effects. You may report side effects to FDA at 1-800-FDA-1088. Where should I keep my medicine? This does not apply. The injection will be given to you by a health care professional. NOTE: This sheet is a summary. It may not cover all possible information. If you have questions about this medicine, talk to your doctor, pharmacist, or health care provider.    2016,  Elsevier/Gold Standard. (2008-08-14 18:37:56)

## 2016-06-08 ENCOUNTER — Institutional Professional Consult (permissible substitution): Payer: Medicaid Other

## 2016-06-09 ENCOUNTER — Encounter (HOSPITAL_COMMUNITY): Payer: Self-pay | Admitting: *Deleted

## 2016-07-17 ENCOUNTER — Other Ambulatory Visit: Payer: Self-pay | Admitting: Obstetrics and Gynecology

## 2016-07-24 ENCOUNTER — Ambulatory Visit: Admit: 2016-07-24 | Payer: Medicaid Other | Admitting: Obstetrics and Gynecology

## 2016-07-24 SURGERY — LIGATION, FALLOPIAN TUBE, LAPAROSCOPIC
Anesthesia: Choice | Site: Abdomen | Laterality: Bilateral

## 2016-08-21 ENCOUNTER — Encounter (HOSPITAL_COMMUNITY): Payer: Self-pay | Admitting: *Deleted

## 2016-08-21 ENCOUNTER — Emergency Department (HOSPITAL_COMMUNITY)
Admission: EM | Admit: 2016-08-21 | Discharge: 2016-08-21 | Disposition: A | Discharge status: Home / Self Care | Payer: Medicaid Other | Attending: Emergency Medicine | Admitting: Emergency Medicine

## 2016-08-21 ENCOUNTER — Emergency Department (HOSPITAL_COMMUNITY): Payer: Medicaid Other

## 2016-08-21 DIAGNOSIS — R079 Chest pain, unspecified: Secondary | ICD-10-CM | POA: Diagnosis present

## 2016-08-21 DIAGNOSIS — Z87891 Personal history of nicotine dependence: Secondary | ICD-10-CM | POA: Insufficient documentation

## 2016-08-21 DIAGNOSIS — R0789 Other chest pain: Secondary | ICD-10-CM | POA: Diagnosis not present

## 2016-08-21 LAB — CBC
HEMATOCRIT: 38.4 % (ref 36.0–46.0)
Hemoglobin: 12.2 g/dL (ref 12.0–15.0)
MCH: 26.1 pg (ref 26.0–34.0)
MCHC: 31.8 g/dL (ref 30.0–36.0)
MCV: 82.2 fL (ref 78.0–100.0)
PLATELETS: 213 10*3/uL (ref 150–400)
RBC: 4.67 MIL/uL (ref 3.87–5.11)
RDW: 13.2 % (ref 11.5–15.5)
WBC: 6.8 10*3/uL (ref 4.0–10.5)

## 2016-08-21 LAB — BASIC METABOLIC PANEL
Anion gap: 7 (ref 5–15)
BUN: 6 mg/dL (ref 6–20)
CALCIUM: 9 mg/dL (ref 8.9–10.3)
CO2: 24 mmol/L (ref 22–32)
Chloride: 107 mmol/L (ref 101–111)
Creatinine, Ser: 0.85 mg/dL (ref 0.44–1.00)
GFR calc Af Amer: >60 mL/min (ref >60)
Glucose, Bld: 110 mg/dL — ABNORMAL HIGH (ref 65–99)
POTASSIUM: 3.5 mmol/L (ref 3.5–5.1)
SODIUM: 138 mmol/L (ref 135–145)

## 2016-08-21 LAB — I-STAT BETA HCG BLOOD, ED (MC, WL, AP ONLY)

## 2016-08-21 LAB — TROPONIN I

## 2016-08-21 MED ORDER — IBUPROFEN 800 MG PO TABS: 800.0000 mg | ORAL_TABLET | Freq: Three times a day (TID) | ORAL | 0 refills | Status: DC

## 2016-08-21 MED ORDER — KETOROLAC TROMETHAMINE 60 MG/2ML IM SOLN
30.0000 mg | Freq: Once | INTRAMUSCULAR | Status: AC
  Administered 2016-08-21: 30 mg via INTRAMUSCULAR
  Filled 2016-08-21: qty 2

## 2016-08-21 NOTE — ED Triage Notes (Signed)
The pt is c/o chest pain since she had a miscarriage in sept 2017  She also wants a preg test  lmp  Last month
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
## 2016-08-21 NOTE — Discharge Instructions (Signed)
Please follow with your primary care doctor in the next 2 days for a check-up. They must obtain records for further management.  ° °Do not hesitate to return to the Emergency Department for any new, worsening or concerning symptoms.  ° °

## 2016-10-12 ENCOUNTER — Ambulatory Visit (INDEPENDENT_AMBULATORY_CARE_PROVIDER_SITE_OTHER): Payer: Medicaid Other | Admitting: *Deleted

## 2016-10-12 DIAGNOSIS — Z3202 Encounter for pregnancy test, result negative: Secondary | ICD-10-CM | POA: Diagnosis not present

## 2016-10-12 DIAGNOSIS — N926 Irregular menstruation, unspecified: Secondary | ICD-10-CM

## 2016-10-12 LAB — POCT URINE PREGNANCY: PREG TEST UR: NEGATIVE

## 2016-10-12 NOTE — Progress Notes (Signed)
Pt is in office for UPT.   UPT in office today is negative. Pt states that she has not had cycle in 4 months.  Pt did get depo injection in November and has not had injection since.  Pt advised she may not have had cycle due to depo. Pt advised there is no way to know when she may start cycle.   Pt states that she is considering another pregnancy. Pt has no other concerns today. Pt advised to call if any other needs.

## 2016-12-14 ENCOUNTER — Emergency Department (HOSPITAL_COMMUNITY)
Admission: EM | Admit: 2016-12-14 | Discharge: 2016-12-14 | Disposition: A | Discharge status: Home / Self Care | Payer: Medicaid Other | Attending: Emergency Medicine | Admitting: Emergency Medicine

## 2016-12-14 ENCOUNTER — Other Ambulatory Visit: Payer: Self-pay | Admitting: Emergency Medicine

## 2016-12-14 ENCOUNTER — Other Ambulatory Visit: Payer: Self-pay

## 2016-12-14 LAB — COMPREHENSIVE METABOLIC PANEL
ALBUMIN: 3.5 g/dL (ref 3.5–5.0)
ALT: 15 U/L (ref 14–54)
ANION GAP: 8 (ref 5–15)
AST: 21 U/L (ref 15–41)
Alkaline Phosphatase: 44 U/L (ref 38–126)
BUN: 9 mg/dL (ref 6–20)
CO2: 22 mmol/L (ref 22–32)
Calcium: 8.9 mg/dL (ref 8.9–10.3)
Chloride: 106 mmol/L (ref 101–111)
Creatinine, Ser: 1.08 mg/dL — ABNORMAL HIGH (ref 0.44–1.00)
GFR calc Af Amer: >60 mL/min (ref >60)
GFR calc non Af Amer: >60 mL/min (ref >60)
GLUCOSE: 133 mg/dL — AB (ref 65–99)
POTASSIUM: 2.9 mmol/L — AB (ref 3.5–5.1)
SODIUM: 136 mmol/L (ref 135–145)
Total Bilirubin: 0.2 mg/dL — ABNORMAL LOW (ref 0.3–1.2)
Total Protein: 6.8 g/dL (ref 6.5–8.1)

## 2016-12-14 LAB — CBC
HCT: 34.2 % — ABNORMAL LOW (ref 36.0–46.0)
Hemoglobin: 10.8 g/dL — ABNORMAL LOW (ref 12.0–15.0)
MCH: 25.3 pg — ABNORMAL LOW (ref 26.0–34.0)
MCHC: 31.6 g/dL (ref 30.0–36.0)
MCV: 80.1 fL (ref 78.0–100.0)
Platelets: 244 10*3/uL (ref 150–400)
RBC: 4.27 MIL/uL (ref 3.87–5.11)
RDW: 13.3 % (ref 11.5–15.5)
WBC: 8.8 10*3/uL (ref 4.0–10.5)

## 2016-12-14 LAB — TROPONIN I: Troponin I: <0.03 ng/mL (ref <0.03)

## 2017-02-16 ENCOUNTER — Emergency Department (HOSPITAL_COMMUNITY)
Admission: EM | Admit: 2017-02-16 | Discharge: 2017-02-16 | Disposition: A | Discharge status: Home / Self Care | Payer: Medicaid Other | Attending: Emergency Medicine | Admitting: Emergency Medicine

## 2017-02-16 ENCOUNTER — Encounter (HOSPITAL_COMMUNITY): Payer: Self-pay | Admitting: *Deleted

## 2017-02-16 DIAGNOSIS — F17210 Nicotine dependence, cigarettes, uncomplicated: Secondary | ICD-10-CM | POA: Insufficient documentation

## 2017-02-16 DIAGNOSIS — K029 Dental caries, unspecified: Secondary | ICD-10-CM | POA: Diagnosis not present

## 2017-02-16 DIAGNOSIS — K0889 Other specified disorders of teeth and supporting structures: Secondary | ICD-10-CM | POA: Diagnosis present

## 2017-02-16 MED ORDER — PENICILLIN V POTASSIUM 500 MG PO TABS: 500.0000 mg | ORAL_TABLET | Freq: Four times a day (QID) | ORAL | 0 refills | Status: AC

## 2017-02-16 MED ORDER — IBUPROFEN 600 MG PO TABS: 600.0000 mg | ORAL_TABLET | Freq: Four times a day (QID) | ORAL | 0 refills | Status: DC | PRN

## 2017-02-16 NOTE — Discharge Instructions (Signed)
Please read and follow all provided instructions.  Your diagnoses today include:  1. Pain, dental   2. Dental caries     Tests performed today include: Vital signs. See below for your results today.   Medications prescribed:  Take as prescribed   Home care instructions:  Follow any educational materials contained in this packet.  Follow-up instructions: Please follow-up with a Dentist for further evaluation of symptoms and treatment   Return instructions:  Please return to the Emergency Department if you do not get better, if you get worse, or new symptoms OR  - Fever (temperature greater than 101.54F)  - Bleeding that does not stop with holding pressure to the area    -Severe pain (please note that you may be more sore the day after your accident)  - Chest Pain  - Difficulty breathing  - Severe nausea or vomiting  - Inability to tolerate food and liquids  - Passing out  - Skin becoming red around your wounds  - Change in mental status (confusion or lethargy)  - New numbness or weakness    Please return if you have any other emergent concerns.  Additional Information:  Your vital signs today were: BP 101/68 (BP Location: Left Arm)    Pulse 73    Temp 97.8 F (36.6 C) (Oral)    Resp 14    Ht 5\' 5"  (1.651 m)    Wt 89.8 kg (198 lb)    LMP 02/12/2017    SpO2 98%    Breastfeeding? No    BMI 32.95 kg/m  If your blood pressure (BP) was elevated above 135/85 this visit, please have this repeated by your doctor within one month. ---------------
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
## 2017-02-23 ENCOUNTER — Emergency Department (HOSPITAL_COMMUNITY)
Admission: EM | Admit: 2017-02-23 | Discharge: 2017-02-23 | Disposition: A | Discharge status: Home / Self Care | Payer: Medicaid Other | Attending: Emergency Medicine | Admitting: Emergency Medicine

## 2017-02-23 ENCOUNTER — Encounter (HOSPITAL_COMMUNITY): Payer: Self-pay

## 2017-02-23 DIAGNOSIS — H5711 Ocular pain, right eye: Secondary | ICD-10-CM | POA: Diagnosis present

## 2017-02-23 DIAGNOSIS — F17210 Nicotine dependence, cigarettes, uncomplicated: Secondary | ICD-10-CM | POA: Insufficient documentation

## 2017-02-23 DIAGNOSIS — Z79899 Other long term (current) drug therapy: Secondary | ICD-10-CM | POA: Diagnosis not present

## 2017-02-23 DIAGNOSIS — H1031 Unspecified acute conjunctivitis, right eye: Secondary | ICD-10-CM | POA: Insufficient documentation

## 2017-02-23 MED ORDER — GENTAMICIN SULFATE 0.3 % OP SOLN: 2.0000 [drp] | Freq: Four times a day (QID) | OPHTHALMIC | 0 refills | Status: DC

## 2017-02-23 NOTE — ED Triage Notes (Signed)
Pt states she was sleeping and a bug bit her on the leg. She states she smacked the bug and a piece of the bug went in her eye. Some swelling and redness noted to right eye.

## 2017-02-23 NOTE — Discharge Instructions (Signed)
If your eye irritation persists over the next 12 hours, fill the prescription for gentamicin drops you have been given.  Return to the emergency department if your symptoms significantly worsen or change.
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
## 2017-03-28 ENCOUNTER — Ambulatory Visit: Payer: Medicaid Other

## 2017-04-05 IMAGING — DX DG CHEST 2V
2 series · 2 of 2 positions shown · non-contrast
Comparison: Chest radiograph performed 02/11/2015

CLINICAL DATA: Acute onset of shortness of breath, and left and
central chest pain. Initial encounter.

EXAM:
CHEST  2 VIEW

[chest pa]
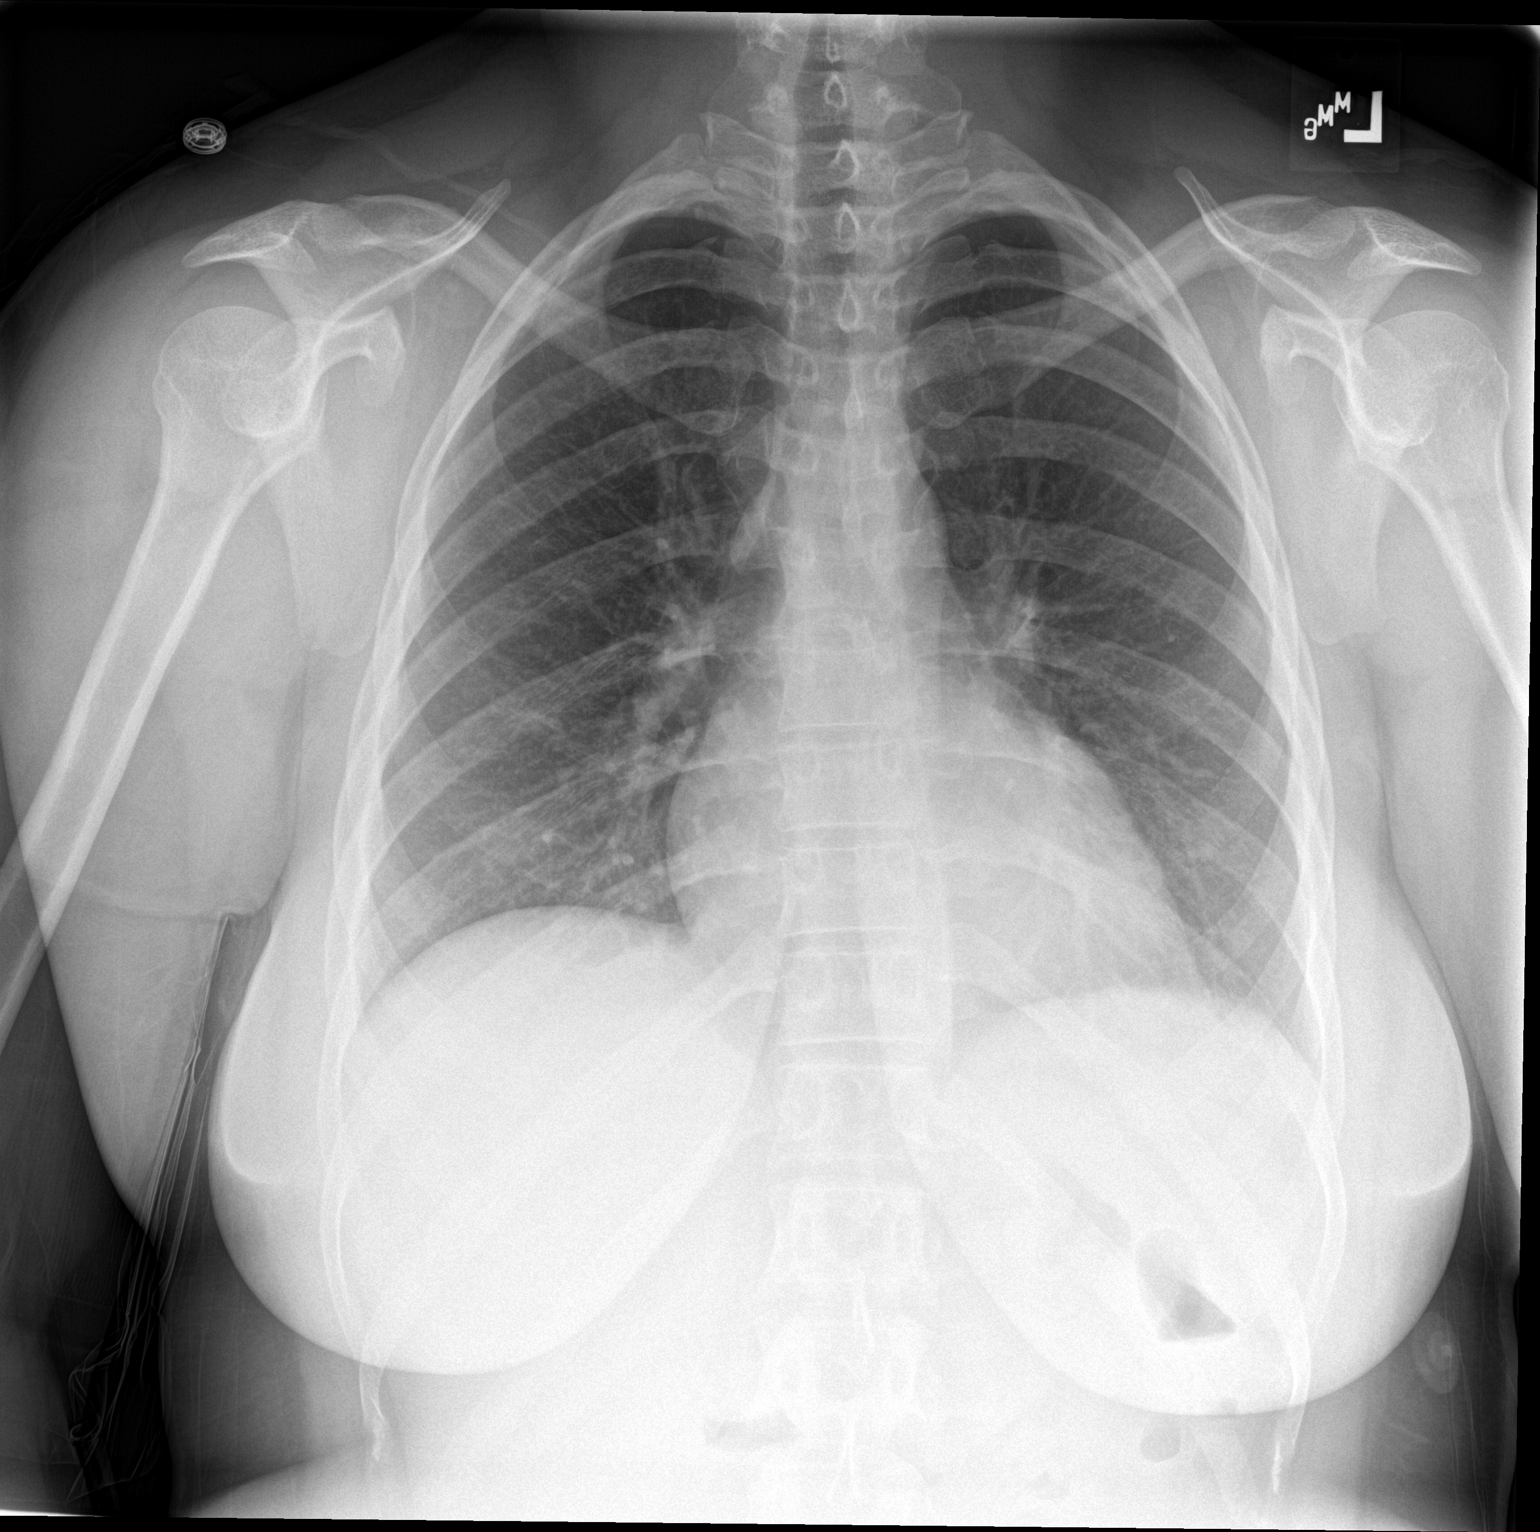

[chest lat]
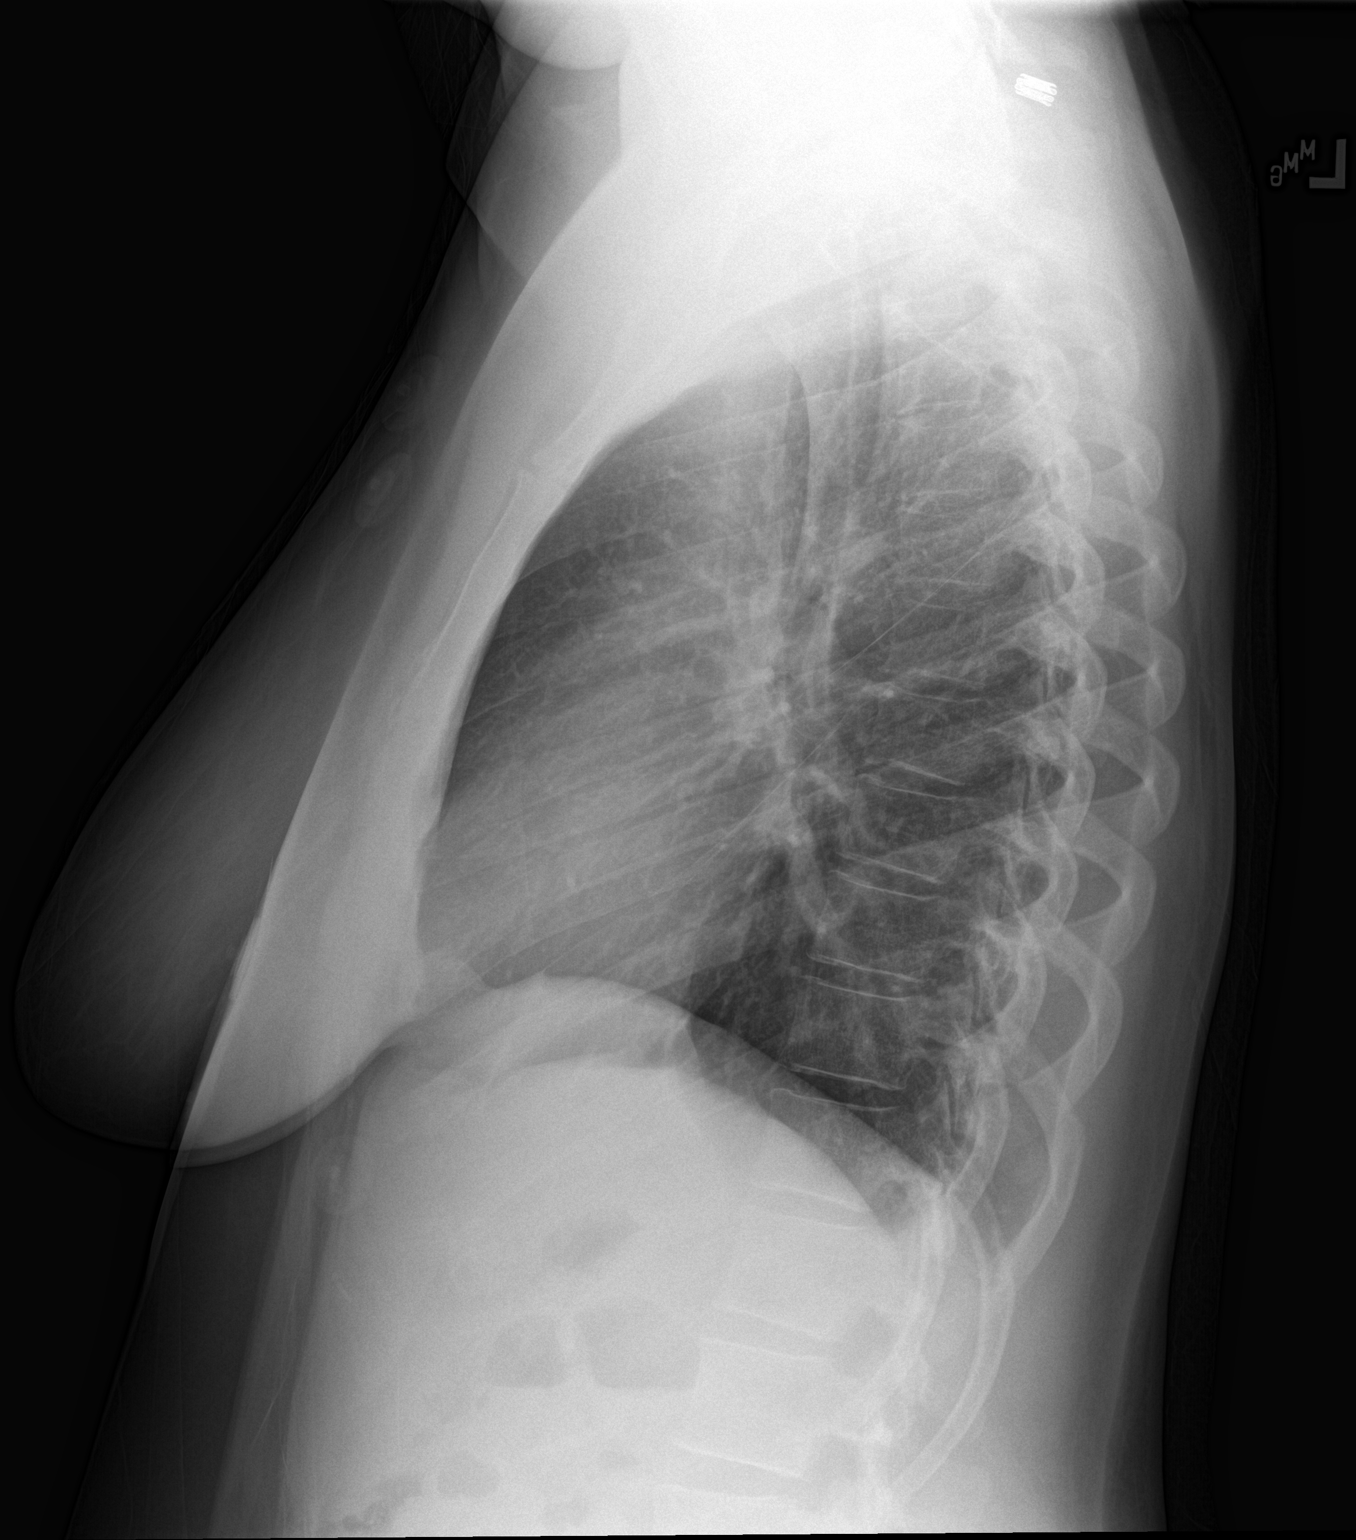

[2 of 2 positions shown; findings below may reference images not displayed]

FINDINGS: The lungs are well-aerated and clear. There is no evidence of focal
opacification, pleural effusion or pneumothorax.

The heart is borderline normal in size. No acute osseous
abnormalities are seen.
IMPRESSION: No acute cardiopulmonary process seen.

## 2017-05-09 ENCOUNTER — Emergency Department (HOSPITAL_COMMUNITY)
Admission: EM | Admit: 2017-05-09 | Discharge: 2017-05-09 | Disposition: A | Discharge status: Home / Self Care | Payer: Medicaid Other | Attending: Emergency Medicine | Admitting: Emergency Medicine

## 2017-05-09 ENCOUNTER — Encounter (HOSPITAL_COMMUNITY): Payer: Self-pay | Admitting: Emergency Medicine

## 2017-05-09 DIAGNOSIS — F17210 Nicotine dependence, cigarettes, uncomplicated: Secondary | ICD-10-CM | POA: Diagnosis not present

## 2017-05-09 DIAGNOSIS — R0600 Dyspnea, unspecified: Secondary | ICD-10-CM | POA: Diagnosis present

## 2017-05-09 DIAGNOSIS — J029 Acute pharyngitis, unspecified: Secondary | ICD-10-CM | POA: Diagnosis not present

## 2017-05-09 MED ORDER — PREDNISONE 20 MG PO TABS: 40.0000 mg | ORAL_TABLET | Freq: Every day | ORAL | 0 refills | Status: DC

## 2017-05-09 MED ORDER — PREDNISONE 20 MG PO TABS
60.0000 mg | ORAL_TABLET | Freq: Once | ORAL | Status: AC
  Administered 2017-05-09: 60 mg via ORAL
  Filled 2017-05-09: qty 3

## 2017-05-09 NOTE — ED Notes (Signed)
Pt BIB [facility] from home c/o SOB. Pt has clear lung sounds and is speaking in complete sentences. No dyspnea noted with ambulation. Hx of anxiety. A&Ox4. Ambulatory.
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
## 2017-05-09 NOTE — Discharge Instructions (Signed)
As discussed, your evaluation today has been largely reassuring.  But, it is important that you monitor your condition carefully, and do not hesitate to return to the ED if you develop new, or concerning changes in your condition. ? ?Otherwise, please follow-up with your physician for appropriate ongoing care. ? ?

## 2017-05-10 ENCOUNTER — Emergency Department (HOSPITAL_COMMUNITY)
Admission: EM | Admit: 2017-05-10 | Discharge: 2017-05-11 | Disposition: A | Discharge status: Home / Self Care | Payer: Medicaid Other | Attending: Emergency Medicine | Admitting: Emergency Medicine

## 2017-05-10 DIAGNOSIS — F17210 Nicotine dependence, cigarettes, uncomplicated: Secondary | ICD-10-CM | POA: Diagnosis not present

## 2017-05-10 DIAGNOSIS — J028 Acute pharyngitis due to other specified organisms: Secondary | ICD-10-CM

## 2017-05-10 DIAGNOSIS — J029 Acute pharyngitis, unspecified: Secondary | ICD-10-CM | POA: Diagnosis not present

## 2017-05-10 DIAGNOSIS — R42 Dizziness and giddiness: Secondary | ICD-10-CM

## 2017-05-10 DIAGNOSIS — B9789 Other viral agents as the cause of diseases classified elsewhere: Secondary | ICD-10-CM

## 2017-05-10 LAB — CBG MONITORING, ED: GLUCOSE-CAPILLARY: 107 mg/dL — AB (ref 65–99)

## 2017-05-10 LAB — BASIC METABOLIC PANEL
ANION GAP: 6 (ref 5–15)
BUN: 6 mg/dL (ref 6–20)
CHLORIDE: 103 mmol/L (ref 101–111)
CO2: 24 mmol/L (ref 22–32)
Calcium: 8.6 mg/dL — ABNORMAL LOW (ref 8.9–10.3)
Creatinine, Ser: 1.04 mg/dL — ABNORMAL HIGH (ref 0.44–1.00)
GFR calc Af Amer: >60 mL/min (ref >60)
Glucose, Bld: 101 mg/dL — ABNORMAL HIGH (ref 65–99)
POTASSIUM: 3.1 mmol/L — AB (ref 3.5–5.1)
SODIUM: 133 mmol/L — AB (ref 135–145)

## 2017-05-10 LAB — URINALYSIS, ROUTINE W REFLEX MICROSCOPIC
BILIRUBIN URINE: NEGATIVE
Glucose, UA: NEGATIVE mg/dL
KETONES UR: NEGATIVE mg/dL
LEUKOCYTES UA: NEGATIVE
NITRITE: NEGATIVE
PROTEIN: NEGATIVE mg/dL
SQUAMOUS EPITHELIAL / LPF: NONE SEEN
Specific Gravity, Urine: 1.001 — ABNORMAL LOW (ref 1.005–1.030)
pH: 6 (ref 5.0–8.0)

## 2017-05-10 LAB — CBC
HCT: 30 % — ABNORMAL LOW (ref 36.0–46.0)
Hemoglobin: 9.3 g/dL — ABNORMAL LOW (ref 12.0–15.0)
MCH: 23.9 pg — ABNORMAL LOW (ref 26.0–34.0)
MCHC: 31 g/dL (ref 30.0–36.0)
MCV: 77.1 fL — ABNORMAL LOW (ref 78.0–100.0)
Platelets: 247 10*3/uL (ref 150–400)
RBC: 3.89 MIL/uL (ref 3.87–5.11)
RDW: 14.6 % (ref 11.5–15.5)
WBC: 8.5 10*3/uL (ref 4.0–10.5)

## 2017-05-10 LAB — RAPID STREP SCREEN (MED CTR MEBANE ONLY): Streptococcus, Group A Screen (Direct): NEGATIVE

## 2017-05-10 LAB — I-STAT BETA HCG BLOOD, ED (MC, WL, AP ONLY)

## 2017-05-10 LAB — MONONUCLEOSIS SCREEN: Mono Screen: NEGATIVE

## 2017-05-10 MED ORDER — LIDOCAINE VISCOUS 2 % MT SOLN
15.0000 mL | Freq: Once | OROMUCOSAL | Status: AC
  Administered 2017-05-10: 15 mL via OROMUCOSAL
  Filled 2017-05-10: qty 15

## 2017-05-10 MED ORDER — PREDNISONE 20 MG PO TABS
40.0000 mg | ORAL_TABLET | Freq: Once | ORAL | Status: AC
  Administered 2017-05-10: 40 mg via ORAL
  Filled 2017-05-10: qty 2

## 2017-05-10 NOTE — ED Triage Notes (Signed)
Per EMS pt was picking child up from school around 1430 and started to feel dizzy and a little SOB.  She later called EMS because she got progressively Dizzy.  Pt was AOx4 when EMS arrived.  Pt is now AOx4 VSS, NAD noted.
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
## 2017-05-12 DIAGNOSIS — R0989 Other specified symptoms and signs involving the circulatory and respiratory systems: Secondary | ICD-10-CM | POA: Diagnosis not present

## 2017-05-12 DIAGNOSIS — R0602 Shortness of breath: Secondary | ICD-10-CM

## 2017-05-12 DIAGNOSIS — Z79899 Other long term (current) drug therapy: Secondary | ICD-10-CM

## 2017-05-12 DIAGNOSIS — F17210 Nicotine dependence, cigarettes, uncomplicated: Secondary | ICD-10-CM

## 2017-05-12 DIAGNOSIS — R062 Wheezing: Secondary | ICD-10-CM | POA: Insufficient documentation

## 2017-05-13 ENCOUNTER — Encounter (HOSPITAL_COMMUNITY): Payer: Self-pay | Admitting: Emergency Medicine

## 2017-05-13 ENCOUNTER — Emergency Department (HOSPITAL_COMMUNITY)
Admission: EM | Admit: 2017-05-13 | Discharge: 2017-05-13 | Disposition: A | Discharge status: Home / Self Care | Payer: Medicaid Other | Source: Home / Self Care | Attending: Emergency Medicine | Admitting: Emergency Medicine

## 2017-05-13 ENCOUNTER — Emergency Department (HOSPITAL_COMMUNITY)
Admission: EM | Admit: 2017-05-13 | Discharge: 2017-05-13 | Disposition: A | Discharge status: Home / Self Care | Payer: Medicaid Other | Attending: Emergency Medicine | Admitting: Emergency Medicine

## 2017-05-13 ENCOUNTER — Emergency Department (HOSPITAL_COMMUNITY): Payer: Medicaid Other

## 2017-05-13 DIAGNOSIS — R0602 Shortness of breath: Secondary | ICD-10-CM

## 2017-05-13 DIAGNOSIS — Z79899 Other long term (current) drug therapy: Secondary | ICD-10-CM | POA: Insufficient documentation

## 2017-05-13 DIAGNOSIS — F17210 Nicotine dependence, cigarettes, uncomplicated: Secondary | ICD-10-CM | POA: Insufficient documentation

## 2017-05-13 DIAGNOSIS — R0989 Other specified symptoms and signs involving the circulatory and respiratory systems: Secondary | ICD-10-CM | POA: Insufficient documentation

## 2017-05-13 LAB — CULTURE, GROUP A STREP (THRC)

## 2017-05-13 MED ORDER — IPRATROPIUM-ALBUTEROL 0.5-2.5 (3) MG/3ML IN SOLN
3.0000 mL | Freq: Once | RESPIRATORY_TRACT | Status: AC
  Administered 2017-05-13: 3 mL via RESPIRATORY_TRACT
  Filled 2017-05-13: qty 3

## 2017-05-13 MED ORDER — ALBUTEROL SULFATE HFA 108 (90 BASE) MCG/ACT IN AERS: 2.0000 | INHALATION_SPRAY | Freq: Four times a day (QID) | RESPIRATORY_TRACT | 0 refills | Status: AC | PRN
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
## 2017-05-13 NOTE — Discharge Instructions (Signed)
Use albuterol inhaler as directed She'll be.  Continue taking prednisone as directed.  Follow-up to primary care doctor next 24-48 hours for further evaluation.  Return to emergency department for any worsening difficulty breathing, chest pain, fever, sore throat, swelling in her lips, tongue, vomiting, or any other worsening or concerning symptoms.

## 2017-05-13 NOTE — ED Triage Notes (Signed)
Pt c/o shortness of breath. Seen recently for the same, prescribed prednisone, states she missed her dose this morning. LS clear.
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
## 2017-05-13 NOTE — ED Triage Notes (Signed)
Pt BIB [facility] for sore throat. Pt has a history of chronic bronchitis. Pt was seen here yesterday for the same and was given prednisone prescription.

## 2017-05-13 NOTE — Discharge Instructions (Signed)
Return if any problems.
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
## 2017-08-04 ENCOUNTER — Emergency Department (HOSPITAL_COMMUNITY): Payer: Medicaid Other

## 2017-08-04 ENCOUNTER — Other Ambulatory Visit: Payer: Self-pay

## 2017-08-04 ENCOUNTER — Encounter (HOSPITAL_COMMUNITY): Payer: Self-pay | Admitting: Emergency Medicine

## 2017-08-04 ENCOUNTER — Emergency Department (HOSPITAL_COMMUNITY)
Admission: EM | Admit: 2017-08-04 | Discharge: 2017-08-04 | Disposition: A | Discharge status: Home / Self Care | Payer: Medicaid Other | Attending: Emergency Medicine | Admitting: Emergency Medicine

## 2017-08-04 DIAGNOSIS — Z5321 Procedure and treatment not carried out due to patient leaving prior to being seen by health care provider: Secondary | ICD-10-CM | POA: Diagnosis not present

## 2017-08-04 DIAGNOSIS — R0602 Shortness of breath: Secondary | ICD-10-CM | POA: Diagnosis not present

## 2017-08-04 NOTE — ED Notes (Signed)
Pt states that she is leaving due to wait times  

## 2017-08-04 NOTE — ED Notes (Signed)
Pt stated she wasn't able to wait any longer.
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
## 2017-09-04 ENCOUNTER — Encounter (HOSPITAL_COMMUNITY): Payer: Self-pay | Admitting: *Deleted

## 2017-09-04 ENCOUNTER — Other Ambulatory Visit: Payer: Self-pay

## 2017-09-04 ENCOUNTER — Emergency Department (HOSPITAL_COMMUNITY)
Admission: EM | Admit: 2017-09-04 | Discharge: 2017-09-04 | Disposition: A | Discharge status: Home / Self Care | Payer: Medicaid Other | Attending: Emergency Medicine | Admitting: Emergency Medicine

## 2017-09-04 DIAGNOSIS — R070 Pain in throat: Secondary | ICD-10-CM | POA: Diagnosis present

## 2017-09-04 DIAGNOSIS — F17210 Nicotine dependence, cigarettes, uncomplicated: Secondary | ICD-10-CM | POA: Insufficient documentation

## 2017-09-04 DIAGNOSIS — J029 Acute pharyngitis, unspecified: Secondary | ICD-10-CM

## 2017-09-04 LAB — CBC WITH DIFFERENTIAL/PLATELET
Basophils Absolute: 0 10*3/uL (ref 0.0–0.1)
Basophils Relative: 0 %
EOS ABS: 0.2 10*3/uL (ref 0.0–0.7)
EOS PCT: 2 %
HCT: 35 % — ABNORMAL LOW (ref 36.0–46.0)
Hemoglobin: 11.1 g/dL — ABNORMAL LOW (ref 12.0–15.0)
LYMPHS ABS: 3 10*3/uL (ref 0.7–4.0)
Lymphocytes Relative: 39 %
MCH: 24.1 pg — AB (ref 26.0–34.0)
MCHC: 31.7 g/dL (ref 30.0–36.0)
MCV: 76.1 fL — ABNORMAL LOW (ref 78.0–100.0)
Monocytes Absolute: 0.4 10*3/uL (ref 0.1–1.0)
Monocytes Relative: 5 %
Neutro Abs: 4.2 10*3/uL (ref 1.7–7.7)
Neutrophils Relative %: 54 %
PLATELETS: 327 10*3/uL (ref 150–400)
RBC: 4.6 MIL/uL (ref 3.87–5.11)
RDW: 14.3 % (ref 11.5–15.5)
WBC: 7.8 10*3/uL (ref 4.0–10.5)

## 2017-09-04 LAB — MONONUCLEOSIS SCREEN: MONO SCREEN: NEGATIVE

## 2017-09-04 LAB — RAPID STREP SCREEN (MED CTR MEBANE ONLY): STREPTOCOCCUS, GROUP A SCREEN (DIRECT): NEGATIVE

## 2017-09-04 MED ORDER — DEXAMETHASONE SODIUM PHOSPHATE 10 MG/ML IJ SOLN
10.0000 mg | Freq: Once | INTRAMUSCULAR | Status: AC
  Administered 2017-09-04: 10 mg via INTRAMUSCULAR
  Filled 2017-09-04: qty 1

## 2017-09-04 MED ORDER — PREDNISONE 20 MG PO TABS: 40.0000 mg | ORAL_TABLET | Freq: Every day | ORAL | 0 refills | Status: AC

## 2017-09-04 NOTE — Discharge Instructions (Signed)
Please call and schedule a follow-up appointment [facility].  Take 2 tablets of prednisone with breakfast starting tomorrow for the next 4 days.  I have included information regarding foods that may be easier to swallow over the next few days. Hot or cold food and beverages may be easier to swallow. Soups and teas are a good choice. There are no specific restrictions for your diet for the next few days.  Take 600 mg of ibuprofen with food every 8 hours as needed for pain and inflammation.  If you develop new or worsening symptoms, including swelling to one side of your neck or face, drooling, high fever, or if you become unable to open your mouth, please return to the emergency department for reevaluation.

## 2017-09-04 NOTE — ED Notes (Signed)
Patient verbalized understanding of discharge instructions and denies any further needs or questions at this time. VS stable. Patient ambulatory with steady gait.  
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
## 2017-09-04 NOTE — ED Triage Notes (Addendum)
Pt has had sore throat for last few days and hard to eat and swallow.  She feels like her tonsils are closing in and states hard to get breath in. Pt is able to swallow liquids but states she has to force it down.  No fever.  Pt appears to have larger side.  Pt is speaking in clear sentences and not drooling.

## 2017-09-07 LAB — CULTURE, GROUP A STREP (THRC)

## 2018-02-20 ENCOUNTER — Emergency Department (HOSPITAL_COMMUNITY)
Admission: EM | Admit: 2018-02-20 | Discharge: 2018-02-20 | Disposition: A | Discharge status: Home / Self Care | Payer: Medicaid Other | Attending: Emergency Medicine | Admitting: Emergency Medicine

## 2018-02-20 ENCOUNTER — Encounter (HOSPITAL_COMMUNITY): Payer: Self-pay

## 2018-02-20 DIAGNOSIS — F17210 Nicotine dependence, cigarettes, uncomplicated: Secondary | ICD-10-CM | POA: Insufficient documentation

## 2018-02-20 DIAGNOSIS — K0889 Other specified disorders of teeth and supporting structures: Secondary | ICD-10-CM | POA: Diagnosis present

## 2018-02-20 MED ORDER — HYDROCODONE-ACETAMINOPHEN 5-325 MG PO TABS
1.0000 | ORAL_TABLET | Freq: Once | ORAL | Status: AC
  Administered 2018-02-20: 1 via ORAL
  Filled 2018-02-20: qty 1

## 2018-02-20 MED ORDER — PENICILLIN V POTASSIUM 500 MG PO TABS: 500.0000 mg | ORAL_TABLET | Freq: Four times a day (QID) | ORAL | 0 refills | Status: AC

## 2018-02-20 NOTE — ED Triage Notes (Signed)
Pt complains of upper right dental pain

## 2018-02-20 NOTE — Discharge Instructions (Addendum)
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
## 2018-03-08 ENCOUNTER — Encounter: Payer: Self-pay | Admitting: Emergency Medicine

## 2018-03-08 ENCOUNTER — Emergency Department (HOSPITAL_COMMUNITY)
Admission: EM | Admit: 2018-03-08 | Discharge: 2018-03-08 | Disposition: A | Discharge status: Home / Self Care | Payer: Medicaid Other | Attending: Emergency Medicine | Admitting: Emergency Medicine

## 2018-03-08 DIAGNOSIS — Z87891 Personal history of nicotine dependence: Secondary | ICD-10-CM | POA: Insufficient documentation

## 2018-03-08 DIAGNOSIS — K0889 Other specified disorders of teeth and supporting structures: Secondary | ICD-10-CM | POA: Diagnosis not present

## 2018-03-08 MED ORDER — IBUPROFEN 800 MG PO TABS
800.0000 mg | ORAL_TABLET | Freq: Once | ORAL | Status: AC
  Administered 2018-03-08: 800 mg via ORAL
  Filled 2018-03-08: qty 1

## 2018-03-08 MED ORDER — NAPROXEN 500 MG PO TABS: 500.0000 mg | ORAL_TABLET | Freq: Two times a day (BID) | ORAL | 0 refills | Status: AC

## 2018-03-08 MED ORDER — LIDOCAINE VISCOUS HCL 2 % MT SOLN: 15.0000 mL | OROMUCOSAL | 0 refills | Status: DC | PRN

## 2018-03-08 MED ORDER — AMOXICILLIN-POT CLAVULANATE 875-125 MG PO TABS: 1.0000 | ORAL_TABLET | Freq: Two times a day (BID) | ORAL | 0 refills | Status: DC

## 2018-03-08 NOTE — ED Notes (Signed)
Bed: [id number] Expected date:  Expected time:  Means of arrival:  Comments: EMS 37 yo female tooth abscess

## 2018-03-08 NOTE — ED Triage Notes (Signed)
Pt arrived by EMS from home. Pt reports that she was seen at this facility 7/17 for same complaint. Pt reports that she has been out of her medications for 4 days ans now is having right dental pain. Pt has appointment with Dentist 8/5

## 2018-03-08 NOTE — Discharge Instructions (Addendum)
Take antibiotics as prescribed.  Take the entire course, even if your symptoms improve. Take ibuprofen 3 times a day with meals.  Do not take other anti-inflammatories at the same time open (Advil, Motrin, naproxen, Aleve). You may supplement with Tylenol if you need further pain control. Use viscous lidocaine as needed for further pain. It is important that you follow up with a dentist at your scheduled appointment. Return to the emergency room with any new, worsening, or concerning symptoms.
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
## 2018-03-18 IMAGING — US US OB TRANSVAGINAL
1 series · 15 of 28 positions shown · non-contrast
Comparison: 05/08/2013.

CLINICAL DATA: Abdominal pain.

EXAM:
OBSTETRIC <14 WK US AND TRANSVAGINAL OB US
TECHNIQUE: Both transabdominal and transvaginal ultrasound examinations were
performed for complete evaluation of the gestation as well as the
maternal uterus, adnexal regions, and pelvic cul-de-sac.
Transvaginal technique was performed to assess early pregnancy.

[Series 1: us ob transvaginal · 45 acquisitions, 15 frames shown]
[im 1/45]
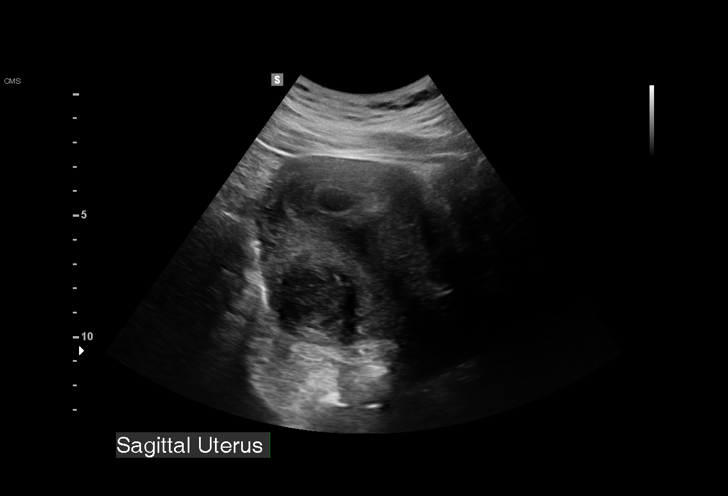
[im 4/45]
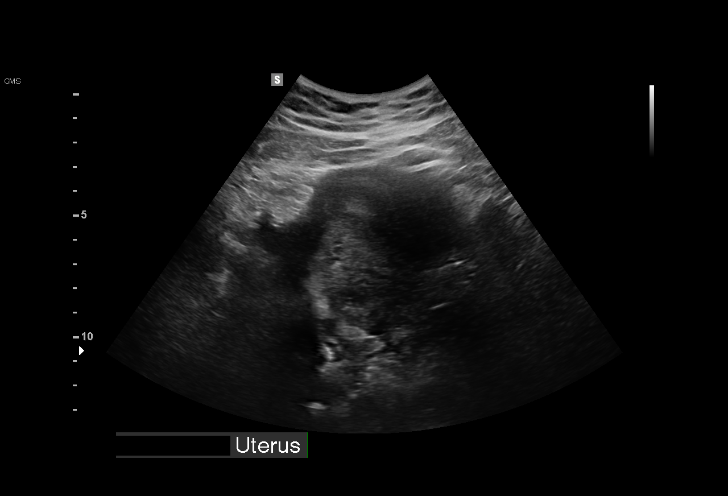
[im 7/45]
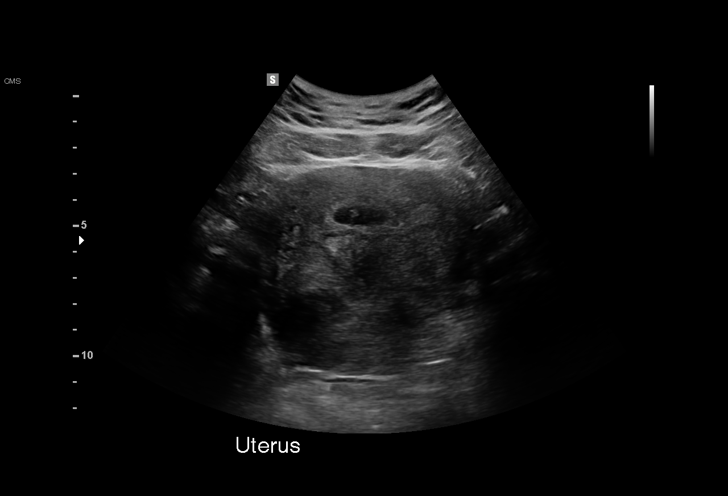
[im 10/45]
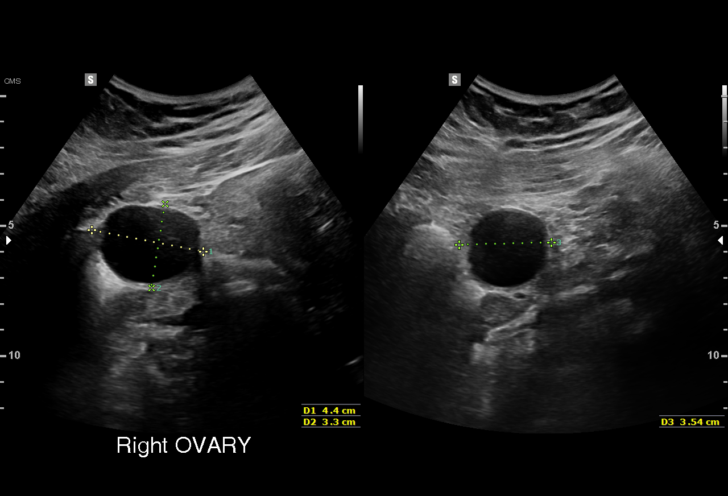
[im 14/45]
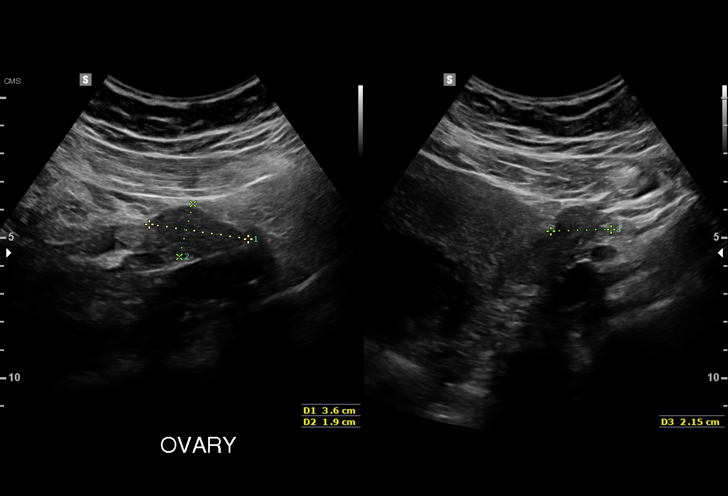
[im 17/45]
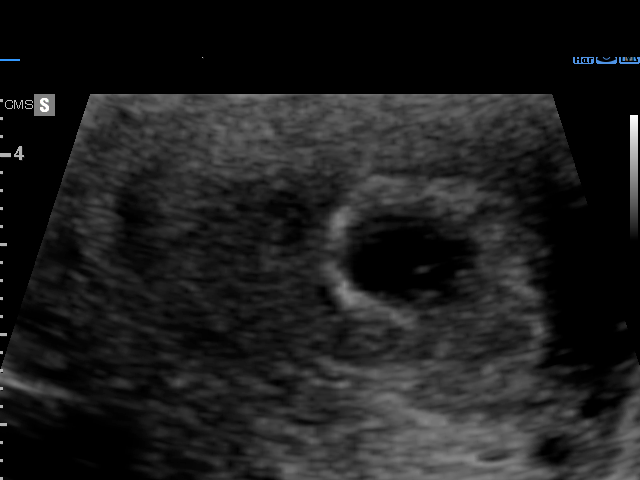
[im 20/45]
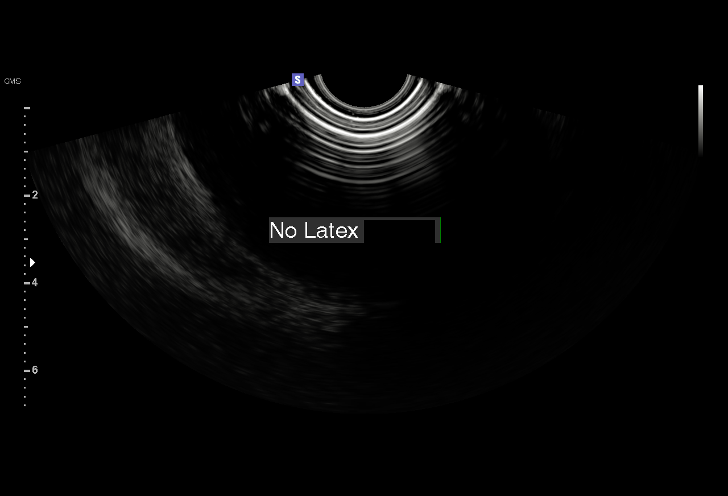
[im 23/45]
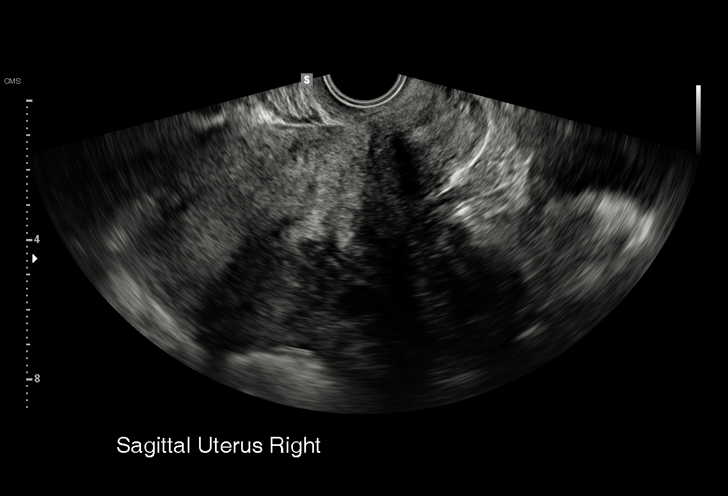
[im 25/45]
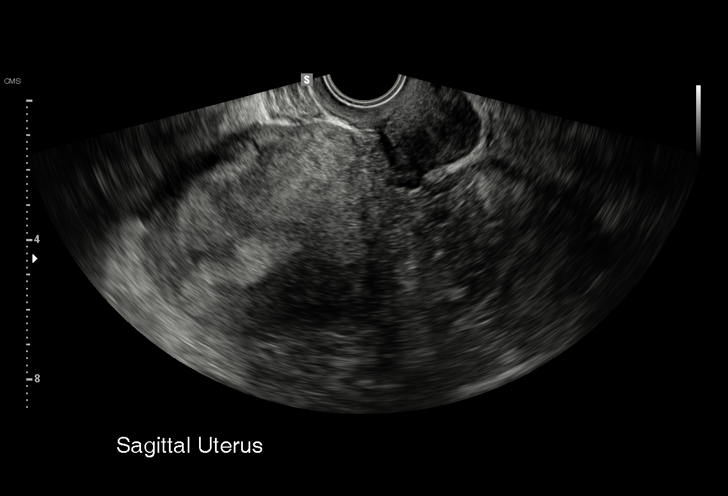
[im 28/45]
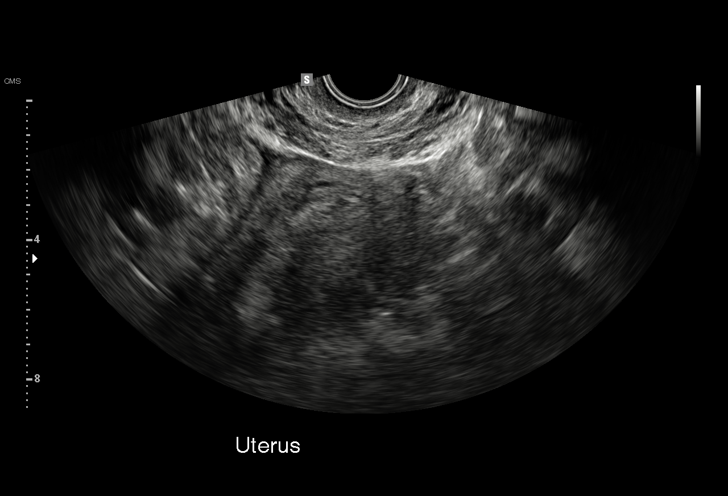
[im 31/45]
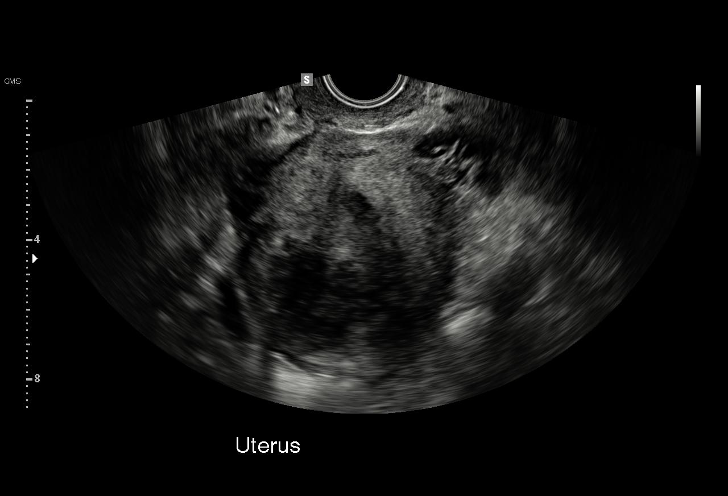
[im 35/45]
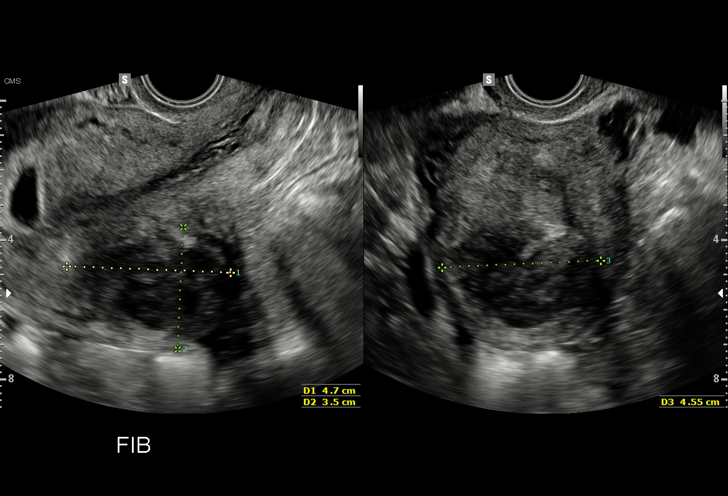
[im 38/45]
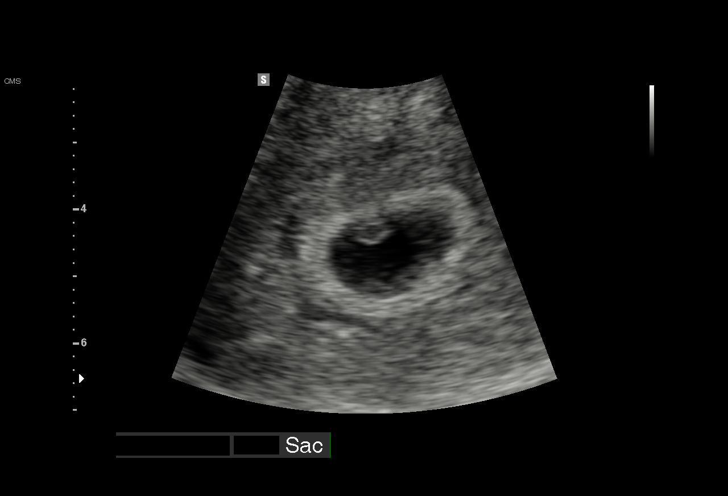
[im 41/45]
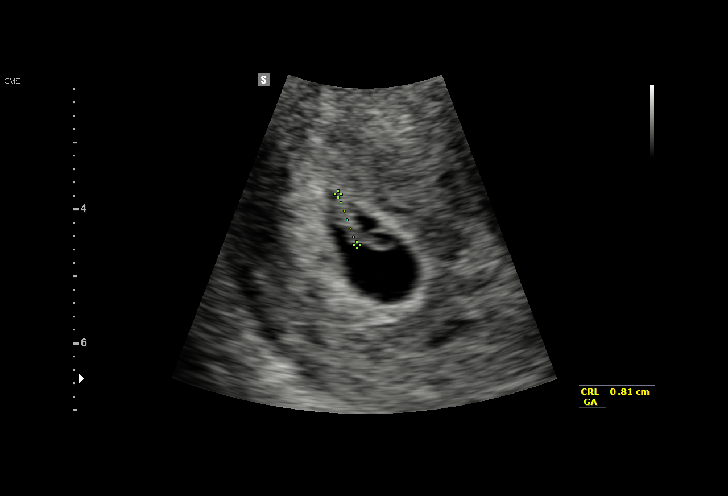
[im 45/45]
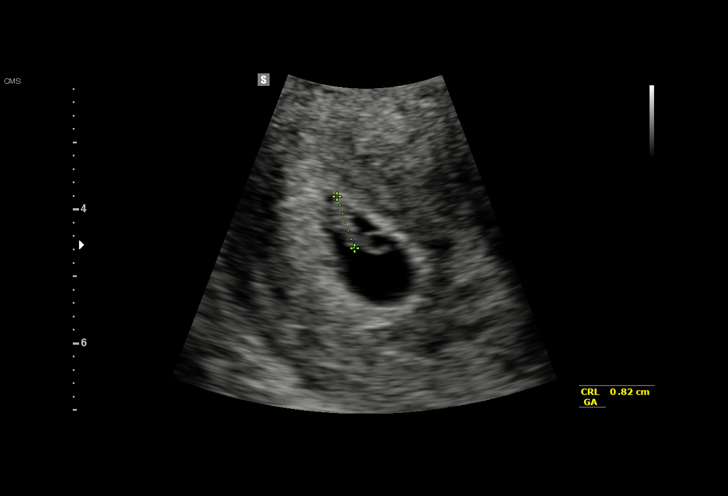

[15 of 28 positions shown; findings below may reference images not displayed]

FINDINGS: Intrauterine gestational sac: Single

Yolk sac:  Present

Embryo:  Present

Cardiac Activity: Present

Heart Rate: 126  bpm

CRL:  8.1  mm   6 w   5 d                  US EDC: 09/24/2016

Subchorionic hemorrhage:  None visualized.

Maternal uterus/adnexae: 3.7 cm simple cyst right ovary. 5.7 cm
posterior uterine fibroid again noted.
IMPRESSION: 1. Single viable intrauterine pregnancy at 6 weeks 5 days.

2. 3.7 cm simple cyst right ovary. 5.7 cm posterior uterine again
noted fibroid.

## 2018-03-24 ENCOUNTER — Emergency Department (HOSPITAL_COMMUNITY)
Admission: EM | Admit: 2018-03-24 | Discharge: 2018-03-24 | Disposition: A | Discharge status: Home / Self Care | Payer: Medicaid Other | Attending: Emergency Medicine | Admitting: Emergency Medicine

## 2018-03-24 ENCOUNTER — Other Ambulatory Visit: Payer: Self-pay

## 2018-03-24 ENCOUNTER — Encounter (HOSPITAL_COMMUNITY): Payer: Self-pay | Admitting: *Deleted

## 2018-03-24 DIAGNOSIS — Z79899 Other long term (current) drug therapy: Secondary | ICD-10-CM | POA: Diagnosis not present

## 2018-03-24 DIAGNOSIS — Z87891 Personal history of nicotine dependence: Secondary | ICD-10-CM | POA: Insufficient documentation

## 2018-03-24 DIAGNOSIS — K0889 Other specified disorders of teeth and supporting structures: Secondary | ICD-10-CM | POA: Diagnosis present

## 2018-03-24 MED ORDER — PENICILLIN V POTASSIUM 500 MG PO TABS: 500.0000 mg | ORAL_TABLET | Freq: Four times a day (QID) | ORAL | 0 refills | Status: AC

## 2018-03-24 MED ORDER — PENICILLIN V POTASSIUM 500 MG PO TABS: 500.0000 mg | ORAL_TABLET | Freq: Four times a day (QID) | ORAL | 0 refills | Status: DC
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
## 2018-03-24 NOTE — Discharge Instructions (Addendum)
Your pain is most likely from an infection.  There was no abscess that I could see or drain.  Take antibiotics as prescribed.  Take ibuprofen 600 mg and/or acetaminophen 500 to 1000 mg every 8 hours for pain.  Apply heat to the area.  Follow-up with dentist as soon as possible.  Return to the ER for fevers, worsening swelling, difficulty breathing.

## 2018-03-24 NOTE — ED Notes (Signed)
Bed: WTR7 Expected date:  Expected time:  Means of arrival:  Comments: EMS toothache x 1 month 37 yo female

## 2018-03-24 NOTE — ED Triage Notes (Signed)
Pt reports that pt has dental pain on the right upper molar that began today.  Pt states that a month ago she had similar pain and was given an antibiotics. Pt a/o x 4 and ambulatory.

## 2018-08-22 ENCOUNTER — Emergency Department (HOSPITAL_COMMUNITY)
Admission: EM | Admit: 2018-08-22 | Discharge: 2018-08-22 | Disposition: A | Discharge status: Home / Self Care | Payer: Medicaid Other | Attending: Emergency Medicine | Admitting: Emergency Medicine

## 2018-08-22 ENCOUNTER — Encounter: Payer: Self-pay | Admitting: Emergency Medicine

## 2018-08-22 ENCOUNTER — Emergency Department (HOSPITAL_COMMUNITY): Payer: Medicaid Other

## 2018-08-22 DIAGNOSIS — R69 Illness, unspecified: Secondary | ICD-10-CM

## 2018-08-22 DIAGNOSIS — Z79899 Other long term (current) drug therapy: Secondary | ICD-10-CM | POA: Diagnosis not present

## 2018-08-22 DIAGNOSIS — R112 Nausea with vomiting, unspecified: Secondary | ICD-10-CM

## 2018-08-22 DIAGNOSIS — J111 Influenza due to unidentified influenza virus with other respiratory manifestations: Secondary | ICD-10-CM | POA: Insufficient documentation

## 2018-08-22 DIAGNOSIS — R509 Fever, unspecified: Secondary | ICD-10-CM | POA: Diagnosis present

## 2018-08-22 DIAGNOSIS — Z87891 Personal history of nicotine dependence: Secondary | ICD-10-CM | POA: Insufficient documentation

## 2018-08-22 LAB — GROUP A STREP BY PCR: Group A Strep by PCR: NOT DETECTED

## 2018-08-22 MED ORDER — ONDANSETRON 4 MG PO TBDP
4.0000 mg | ORAL_TABLET | Freq: Once | ORAL | Status: AC
  Administered 2018-08-22: 4 mg via ORAL
  Filled 2018-08-22: qty 1

## 2018-08-22 MED ORDER — ONDANSETRON 4 MG PO TBDP: ORAL_TABLET | ORAL | 0 refills | Status: DC

## 2018-08-22 MED ORDER — ACETAMINOPHEN 500 MG PO TABS
1000.0000 mg | ORAL_TABLET | Freq: Once | ORAL | Status: AC
  Administered 2018-08-22: 1000 mg via ORAL
  Filled 2018-08-22: qty 2

## 2018-08-22 NOTE — ED Notes (Signed)
Patient verbalizes understanding of discharge instructions. Opportunity for questioning and answers were provided. Armband removed by staff, pt discharged from ED. Pt ambulatory to lobby. Prescriptions and follow up care reviewed.  

## 2018-08-22 NOTE — ED Notes (Signed)
Patient transported to X-ray 

## 2018-08-22 NOTE — Discharge Instructions (Signed)
You have a viral illness, this may be influenza and that likely start to improve after 5-7 days, antibiotics are not helpful in treating viral infections.  You may use Zofran as needed for nausea.  Start with clear fluids and advance your diet to bland foods, return to normal diet as tolerated.  Please make sure you are drinking plenty of fluids. You can treat your symptoms supportively with tylenol/ibuprofen for fevers and pains, Zyrtec and Flonase to heal with nasal congestion, and over the counter cough syrups and throat lozenges to help with cough. If your symptoms are not improving please follow up with you Primary doctor.   If you develop persistent fevers, persistent vomiting, abdominal pain, shortness of breath or difficulty breathing, chest pain, severe headache and neck pain, persistent nausea and vomiting or other new or concerning symptoms return to the Emergency department.

## 2018-08-22 NOTE — ED Triage Notes (Signed)
Pt to ER for evaluation of flu like symptoms x1 week with sore throat, cough, and fever. Reports feeling weak. Temp as high as 102.5 at home.
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
## 2018-08-22 NOTE — ED Notes (Signed)
Pt also endorses nausea and vomiting onset 3-4 days ago.

## 2018-10-30 ENCOUNTER — Other Ambulatory Visit: Payer: Self-pay

## 2018-10-30 ENCOUNTER — Ambulatory Visit (HOSPITAL_COMMUNITY)
Admission: EM | Admit: 2018-10-30 | Discharge: 2018-10-30 | Disposition: A | Discharge status: Home / Self Care | Payer: Medicaid Other | Attending: Family Medicine | Admitting: Family Medicine

## 2018-10-30 ENCOUNTER — Encounter (HOSPITAL_COMMUNITY): Payer: Self-pay | Admitting: Emergency Medicine

## 2018-10-30 DIAGNOSIS — G8929 Other chronic pain: Secondary | ICD-10-CM

## 2018-10-30 DIAGNOSIS — M25531 Pain in right wrist: Secondary | ICD-10-CM | POA: Diagnosis not present

## 2018-10-30 DIAGNOSIS — S63501A Unspecified sprain of right wrist, initial encounter: Secondary | ICD-10-CM

## 2018-10-30 MED ORDER — IBUPROFEN 800 MG PO TABS: 800.0000 mg | ORAL_TABLET | Freq: Three times a day (TID) | ORAL | 0 refills | Status: AC
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
## 2018-10-30 NOTE — Discharge Instructions (Signed)
Wear wrist brace for next 2 weeks to limit motion and allow wrist to rest Use anti-inflammatories for pain/swelling. You may take up to 800 mg Ibuprofen every 8 hours with food. You may supplement Ibuprofen with Tylenol [phone] mg every 8 hours.  Ice Follow up if not improving with rest
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
## 2019-04-02 ENCOUNTER — Emergency Department (HOSPITAL_COMMUNITY)
Admission: EM | Admit: 2019-04-02 | Discharge: 2019-04-03 | Disposition: A | Discharge status: Home / Self Care | Payer: Medicaid Other | Attending: Emergency Medicine | Admitting: Emergency Medicine

## 2019-04-02 DIAGNOSIS — Z87891 Personal history of nicotine dependence: Secondary | ICD-10-CM | POA: Insufficient documentation

## 2019-04-02 DIAGNOSIS — J029 Acute pharyngitis, unspecified: Secondary | ICD-10-CM | POA: Insufficient documentation

## 2019-04-03 ENCOUNTER — Other Ambulatory Visit: Payer: Self-pay

## 2019-04-03 LAB — GROUP A STREP BY PCR: Group A Strep by PCR: NOT DETECTED

## 2019-04-03 MED ORDER — LIDOCAINE VISCOUS HCL 2 % MT SOLN: 15.0000 mL | OROMUCOSAL | 0 refills | Status: AC | PRN

## 2019-04-03 NOTE — ED Triage Notes (Signed)
Pt c/o sore throat that began today. NAD noted.
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
## 2019-04-03 NOTE — Discharge Instructions (Signed)
Can use viscous lidocaine to help with sore throat-- sent to pharmacy for you. Monitor for fever, cough, body aches, etc.  If these develop, you need to quarantine at home as per [facility] guidelines attached below. Follow-up with your primary care doctor. Return here for any new/acute changes.     Person Under Monitoring Name: Erica [name]  Location: [place] [address]   Infection Prevention Recommendations for Individuals Confirmed to have, or Being Evaluated for, 2019 Novel Coronavirus (COVID-19) Infection Who Receive Care at Home  Individuals who are confirmed to have, or are being evaluated for, COVID-19 should follow the prevention steps below until a healthcare provider or local or state health department says they can return to normal activities.  Stay home except to get medical care You should restrict activities outside your home, except for getting medical care. Do not go to work, school, or public areas, and do not use public transportation or taxis.  Call ahead before visiting your doctor Before your medical appointment, call the healthcare provider and tell them that you have, or are being evaluated for, COVID-19 infection. This will help the healthcare providers office take steps to keep other people from getting infected. Ask your healthcare provider to call the local or state health department.  Monitor your symptoms Seek prompt medical attention if your illness is worsening (e.g., difficulty breathing). Before going to your medical appointment, call the healthcare provider and tell them that you have, or are being evaluated for, COVID-19 infection. Ask your healthcare provider to call the local or state health department.  Wear a facemask You should wear a facemask that covers your nose and mouth when you are in the same room with other people and when you visit a healthcare provider. People who live with or visit you should also wear a  facemask while they are in the same room with you.  Separate yourself from other people in your home As much as possible, you should stay in a different room from other people in your home. Also, you should use a separate bathroom, if available.  Avoid sharing household items You should not share dishes, drinking glasses, cups, eating utensils, towels, bedding, or other items with other people in your home. After using these items, you should wash them thoroughly with soap and water.  Cover your coughs and sneezes Cover your mouth and nose with a tissue when you cough or sneeze, or you can cough or sneeze into your sleeve. Throw used tissues in a lined trash can, and immediately wash your hands with soap and water for at least 20 seconds or use an alcohol-based hand rub.  Wash your [facility] your hands often and thoroughly with soap and water for at least 20 seconds. You can use an alcohol-based hand sanitizer if soap and water are not available and if your hands are not visibly dirty. Avoid touching your eyes, nose, and mouth with unwashed hands.   Prevention Steps for Caregivers and Household Members of Individuals Confirmed to have, or Being Evaluated for, COVID-19 Infection Being Cared for in the Home  If you live with, or provide care at home for, a person confirmed to have, or being evaluated for, COVID-19 infection please follow these guidelines to prevent infection:  Follow healthcare providers instructions Make sure that you understand and can help the patient follow any healthcare provider instructions for all care.  Provide for the patients basic needs You should help the patient with basic needs in  the home and provide support for getting groceries, prescriptions, and other personal needs.  Monitor the patients symptoms If they are getting sicker, call his or her medical provider and tell them that the patient has, or is being evaluated for, COVID-19 infection.  This will help the healthcare providers office take steps to keep other people from getting infected. Ask the healthcare provider to call the local or state health department.  Limit the number of people who have contact with the patient If possible, have only one caregiver for the patient. Other household members should stay in another home or place of residence. If this is not possible, they should stay in another room, or be separated from the patient as much as possible. Use a separate bathroom, if available. Restrict visitors who do not have an essential need to be in the home.  Keep older adults, very young children, and other sick people away from the patient Keep older adults, very young children, and those who have compromised immune systems or chronic health conditions away from the patient. This includes people with chronic heart, lung, or kidney conditions, diabetes, and cancer.  Ensure good ventilation Make sure that shared spaces in the home have good air flow, such as from an air conditioner or an opened window, weather permitting.  Wash your hands often Wash your hands often and thoroughly with soap and water for at least 20 seconds. You can use an alcohol based hand sanitizer if soap and water are not available and if your hands are not visibly dirty. Avoid touching your eyes, nose, and mouth with unwashed hands. Use disposable paper towels to dry your hands. If not available, use dedicated cloth towels and replace them when they become wet.  Wear a facemask and gloves Wear a disposable facemask at all times in the room and gloves when you touch or have contact with the patients blood, body fluids, and/or secretions or excretions, such as sweat, saliva, sputum, nasal mucus, vomit, urine, or feces.  Ensure the mask fits over your nose and mouth tightly, and do not touch it during use. Throw out disposable facemasks and gloves after using them. Do not reuse. Wash your hands  immediately after removing your facemask and gloves. If your personal clothing becomes contaminated, carefully remove clothing and launder. Wash your hands after handling contaminated clothing. Place all used disposable facemasks, gloves, and other waste in a lined container before disposing them with other household waste. Remove gloves and wash your hands immediately after handling these items.  Do not share dishes, glasses, or other household items with the patient Avoid sharing household items. You should not share dishes, drinking glasses, cups, eating utensils, towels, bedding, or other items with a patient who is confirmed to have, or being evaluated for, COVID-19 infection. After the person uses these items, you should wash them thoroughly with soap and water.  Wash laundry thoroughly Immediately remove and wash clothes or bedding that have blood, body fluids, and/or secretions or excretions, such as sweat, saliva, sputum, nasal mucus, vomit, urine, or feces, on them. Wear gloves when handling laundry from the patient. Read and follow directions on labels of laundry or clothing items and detergent. In general, wash and dry with the warmest temperatures recommended on the label.  Clean all areas the individual has used often Clean all touchable surfaces, such as counters, tabletops, doorknobs, bathroom fixtures, toilets, phones, keyboards, tablets, and bedside tables, every day. Also, clean any surfaces that may have blood, body fluids,  and/or secretions or excretions on them. Wear gloves when cleaning surfaces the patient has come in contact with. Use a diluted bleach solution (e.g., dilute bleach with 1 part bleach and 10 parts water) or a household disinfectant with a label that says EPA-registered for coronaviruses. To make a bleach solution at home, add 1 tablespoon of bleach to 1 quart (4 cups) of water. For a larger supply, add  cup of bleach to 1 gallon (16 cups) of water. Read  labels of cleaning products and follow recommendations provided on product labels. Labels contain instructions for safe and effective use of the cleaning product including precautions you should take when applying the product, such as wearing gloves or eye protection and making sure you have good ventilation during use of the product. Remove gloves and wash hands immediately after cleaning.  Monitor yourself for signs and symptoms of illness Caregivers and household members are considered close contacts, should monitor their health, and will be asked to limit movement outside of the home to the extent possible. Follow the monitoring steps for close contacts listed on the symptom monitoring form.   ? If you have additional questions, contact your local health department or call the epidemiologist on call at [phone] (available 24/7). ? This guidance is subject to change. For the most up-to-date guidance from [facility], please refer to their website: [email or web address]
